# Patient Record
Sex: Male | Born: 1960
Health system: Southern US, Community
[De-identification: ages and names within clinical notes are randomized; demographics above are authoritative.]

## PROBLEM LIST (undated history)

## (undated) DIAGNOSIS — T7840XA Allergy, unspecified, initial encounter: Secondary | ICD-10-CM

## (undated) DIAGNOSIS — R569 Unspecified convulsions: Secondary | ICD-10-CM

## (undated) HISTORY — DX: Allergy, unspecified, initial encounter: T78.40XA

---

## 2005-01-11 ENCOUNTER — Ambulatory Visit (HOSPITAL_BASED_OUTPATIENT_CLINIC_OR_DEPARTMENT_OTHER): Admission: RE | Admit: 2005-01-11 | Discharge: 2005-01-11 | Payer: Self-pay | Admitting: Urology

## 2005-01-11 ENCOUNTER — Ambulatory Visit (HOSPITAL_COMMUNITY): Admission: RE | Admit: 2005-01-11 | Discharge: 2005-01-11 | Payer: Self-pay | Admitting: Urology

## 2010-07-24 ENCOUNTER — Emergency Department (HOSPITAL_COMMUNITY)
Admission: EM | Admit: 2010-07-24 | Discharge: 2010-07-24 | Payer: Self-pay | Source: Home / Self Care | Admitting: Emergency Medicine

## 2010-11-17 NOTE — Op Note (Signed)
[name], Joel Hughes            ACCOUNT NO.:  [id number]   MEDICAL RECORD NO.:  [id number]          PATIENT TYPE:  AMB   LOCATION:  [facility]                         FACILITY:  [facility]   PHYSICIAN:  Bertram Millard. Dahlstedt, M.D.DATE OF BIRTH:  [birth year]   DATE OF PROCEDURE:  01/11/2005  DATE OF DISCHARGE:                                 OPERATIVE REPORT   PREOPERATIVE DIAGNOSIS:  Left varicocele with abnormal semenanalysis.   POSTOPERATIVE DIAGNOSIS:  Left varicocele with abnormal semenanalysis.   PRINCIPAL PROCEDURE:  Laparoscopic left varicocele ligation.   SURGEON:  Bertram Millard. Dahlstedt, M.D.   FIRST ASSISTANT:  Blanch Media, M.D.   COMPLICATIONS:  None.   ANESTHESIA:  General,   BRIEF HISTORY:  This 50 year old male recently presented to me with several  urologic issues. He was evaluated for hematuria and flank pain. This was  negative. The patient, upon original presentation, did have a varicocele. He  is married and would like to have children in the future.  A semenanalysis  was performed due to the patient's left varicocele and his desire for  producing children. He was found to have an adequate count of 300,000,000  with density of 400 sperm per cc. He had 5% motility with 50% normal forms.  Recheck revealed similar findings.. The patient desires to have management  of his varicocele to improve fertility. He was counseled on the procedure,  risks and complications, as well as other alternatives including open versus  laparoscopic varicocele ligation versus embolism. He has chosen to have  varicocele ligation.   DESCRIPTION OF PROCEDURE:  The patient was administered general anesthetic  after IV antibiotics were administered in the holding area. He was placed in  the supine position. Genitalia and perineum as well as the abdomen were  prepped and draped. His bladder was drained with a catheter. Hasson cannula  was placed into the abdomen through a subumbilical incision. The  abdomen was  insufflated, and a pneumoperitoneum was established. A stab incision was  made just off the midline on the right lower abdomen and a 5-mm trocar  placed. Brief survey of the pelvis was normal. The left inguinal ring was  identified. A small vein was seen entering. There was perhaps a small left  spermatic artery just medial to this. The peritoneum over the artery and  vein was then divided in a T-shaped fashion using the scissors. No other  aberrant veins were seen. There were several small little veins which were  carefully coagulated. Four clips were then placed on the left spermatic vein  after it was skeletonized. At this point, the procedure was felt to have  been completed. The pneumoperitoneum was released. The fascial edges of the  subumbilical incision were closed with 0 Vicryl. Skin edges were  reapproximated in that incision with a 4-0 PDS placed in a subcuticular  fashion. Dermabond was placed on both the small 5-mm port incision and the  subumbilical incision.   The patient tolerated the procedure well. He was awakened and taken to the  PACU in stable condition.   The patient will follow up with me on the 28th  at 3:30. He was discharged on  Vicodin. Activity restrictions were discussed with him preoperatively.       SMD/MEDQ  D:  01/11/2005  T:  01/11/2005  Job:  [id number]

## 2011-06-22 ENCOUNTER — Ambulatory Visit (INDEPENDENT_AMBULATORY_CARE_PROVIDER_SITE_OTHER): Payer: BC Managed Care – PPO

## 2011-06-22 DIAGNOSIS — M109 Gout, unspecified: Secondary | ICD-10-CM

## 2011-07-11 ENCOUNTER — Ambulatory Visit (INDEPENDENT_AMBULATORY_CARE_PROVIDER_SITE_OTHER): Payer: BC Managed Care – PPO

## 2011-07-11 DIAGNOSIS — M79609 Pain in unspecified limb: Secondary | ICD-10-CM

## 2011-07-11 DIAGNOSIS — M109 Gout, unspecified: Secondary | ICD-10-CM

## 2011-10-18 ENCOUNTER — Encounter: Payer: Self-pay | Admitting: Internal Medicine

## 2011-10-18 ENCOUNTER — Ambulatory Visit (INDEPENDENT_AMBULATORY_CARE_PROVIDER_SITE_OTHER): Payer: BC Managed Care – PPO | Admitting: Internal Medicine

## 2011-10-18 ENCOUNTER — Ambulatory Visit: Payer: BC Managed Care – PPO

## 2011-10-18 VITALS — BP 132/80 | HR 65 | Temp 98.5°F | Resp 16 | Ht 68.5 in | Wt 148.4 lb

## 2011-10-18 DIAGNOSIS — Z789 Other specified health status: Secondary | ICD-10-CM

## 2011-10-18 DIAGNOSIS — R490 Dysphonia: Secondary | ICD-10-CM

## 2011-10-18 DIAGNOSIS — S61209A Unspecified open wound of unspecified finger without damage to nail, initial encounter: Secondary | ICD-10-CM

## 2011-10-18 DIAGNOSIS — M25549 Pain in joints of unspecified hand: Secondary | ICD-10-CM

## 2011-10-18 MED ORDER — DOXYCYCLINE HYCLATE 100 MG PO TABS: 100.0000 mg | ORAL_TABLET | Freq: Two times a day (BID) | ORAL | Status: AC

## 2011-10-18 MED ORDER — MUPIROCIN 2 % EX OINT: TOPICAL_OINTMENT | Freq: Three times a day (TID) | CUTANEOUS | Status: AC

## 2011-10-18 NOTE — Progress Notes (Signed)
  Subjective:    Patient ID: Joel Hughes, male    DOB: Jan 15, 1961, 51 y.o.   MRN: [id number]  HPI Has a 3d old wound that is smelly and macerated. Car jack fell on finger causing avulsion like wound. Painful to bend Also continues to have chronic hoarseness, and tender throat for years, ENT consult all normal. Does have allergys, never smoked, no systemic sxs. Does snore.   Review of Systems     Objective:   Physical Exam  Constitutional: He appears well-developed and well-nourished.  HENT:  Right Ear: Tympanic membrane normal.  Left Ear: Tympanic membrane normal.  Nose: Nose normal.  Mouth/Throat: Uvula is midline, oropharynx is clear and moist and mucous membranes are normal.  Neck: Normal range of motion. Neck supple. No thyromegaly present.  Cardiovascular: Normal rate and regular rhythm.   Pulmonary/Chest: Breath sounds normal.  Musculoskeletal: He exhibits edema and tenderness.       Right hand: He exhibits tenderness, laceration and swelling.       Hands:      Crush wound with decreased flexion but no pain on resistance  Lymphadenopathy:    He has no cervical adenopathy.  Neurological: He is alert. He has normal strength. No sensory deficit.    [facility] reading (PRIMARY) by  Dr. Perrin Maltese. NAD        Assessment & Plan:  Wound and contusion index right with maceration from chronically wet dressing. Twice daily wound care Doxy 100mg  bid Bactroban oint. Bid F/up 7--10 days

## 2011-10-25 ENCOUNTER — Ambulatory Visit (INDEPENDENT_AMBULATORY_CARE_PROVIDER_SITE_OTHER): Payer: BC Managed Care – PPO | Admitting: Family Medicine

## 2011-10-25 VITALS — BP 123/79 | HR 60 | Temp 98.1°F | Resp 16 | Ht 68.25 in | Wt 148.8 lb

## 2011-10-25 DIAGNOSIS — S61209A Unspecified open wound of unspecified finger without damage to nail, initial encounter: Secondary | ICD-10-CM

## 2011-10-25 NOTE — Progress Notes (Signed)
Subjective: Patient is here for recheck of his finger wound. It is doing much better by the time he cannot work his [employer] job with it. Is supposed to be on duty tomorrow to Sunday and needs an excuse. His tetanus shot her in so much always came back after about 3 days but is doing okay now.  Objective: Wound on the palmar surface of his right index finger, right across the PIP, is clean and beginning to heal but still needs a lot of healing. There is still some swelling and erythema of the fifth finger, though he says is much better than it was before.  Assessment: Finger wound, slowly improving  Plan Stay off work through this weekend I think by the time he is due to return to work in a week it will be okay for advice he keeps it well taped for a long time until it's a good solid scar.Se

## 2011-10-25 NOTE — Patient Instructions (Addendum)
see. work excuse

## 2011-11-01 ENCOUNTER — Ambulatory Visit (INDEPENDENT_AMBULATORY_CARE_PROVIDER_SITE_OTHER): Payer: BC Managed Care – PPO | Admitting: Internal Medicine

## 2011-11-01 VITALS — BP 121/72 | HR 63 | Temp 98.3°F | Resp 16 | Ht 68.25 in | Wt 149.0 lb

## 2011-11-01 DIAGNOSIS — S68019A Complete traumatic metacarpophalangeal amputation of unspecified thumb, initial encounter: Secondary | ICD-10-CM

## 2011-11-01 DIAGNOSIS — M79644 Pain in right finger(s): Secondary | ICD-10-CM

## 2011-11-01 DIAGNOSIS — M79609 Pain in unspecified limb: Secondary | ICD-10-CM

## 2011-11-01 DIAGNOSIS — S61209A Unspecified open wound of unspecified finger without damage to nail, initial encounter: Secondary | ICD-10-CM

## 2011-11-01 NOTE — Progress Notes (Signed)
  Subjective:    Patient ID: Joel Hughes, male    DOB: 10-16-1960, 51 y.o.   MRN: [id number]  HPI Wound finger and contusion finger and sprain finger all healing   Review of Systems     Objective:   Physical Exam  Improving, anatomic but limited ROM NMV intact     Assessment & Plan:  Wound care  Buddy tape Start stretches

## 2013-10-09 ENCOUNTER — Ambulatory Visit (INDEPENDENT_AMBULATORY_CARE_PROVIDER_SITE_OTHER): Payer: BC Managed Care – PPO | Admitting: Physician Assistant

## 2013-10-09 VITALS — BP 120/72 | HR 76 | Temp 98.3°F | Resp 16 | Ht 67.0 in | Wt 144.0 lb

## 2013-10-09 DIAGNOSIS — Z125 Encounter for screening for malignant neoplasm of prostate: Secondary | ICD-10-CM

## 2013-10-09 DIAGNOSIS — Z13220 Encounter for screening for lipoid disorders: Secondary | ICD-10-CM

## 2013-10-09 DIAGNOSIS — J309 Allergic rhinitis, unspecified: Secondary | ICD-10-CM

## 2013-10-09 DIAGNOSIS — J312 Chronic pharyngitis: Secondary | ICD-10-CM

## 2013-10-09 LAB — POCT CBC
GRANULOCYTE PERCENT: 51.7 % (ref 37–80)
HEMATOCRIT: 48.4 % (ref 43.5–53.7)
HEMOGLOBIN: 15.5 g/dL (ref 14.1–18.1)
LYMPH, POC: 2.7 (ref 0.6–3.4)
MCH, POC: 31.6 pg — AB (ref 27–31.2)
MCHC: 32 g/dL (ref 31.8–35.4)
MCV: 98.6 fL — AB (ref 80–97)
MID (cbc): 0.4 (ref 0–0.9)
MPV: 8.8 fL (ref 0–99.8)
POC GRANULOCYTE: 3.3 (ref 2–6.9)
POC LYMPH %: 42.6 % (ref 10–50)
POC MID %: 5.7 %M (ref 0–12)
Platelet Count, POC: 325 10*3/uL (ref 142–424)
RBC: 4.91 M/uL (ref 4.69–6.13)
RDW, POC: 14.3 %
WBC: 6.3 10*3/uL (ref 4.6–10.2)

## 2013-10-09 MED ORDER — PANTOPRAZOLE SODIUM 40 MG PO TBEC: 40.0000 mg | DELAYED_RELEASE_TABLET | Freq: Every day | ORAL | Status: DC

## 2013-10-09 NOTE — Progress Notes (Signed)
Subjective:    Patient ID: Francis Dowse, male    DOB: 06-05-1961, 53 y.o.   MRN: [id number]  Sore Throat  Pertinent negatives include no coughing, ear discharge, ear pain, stridor or trouble swallowing.    53y.o male pt with hx of GERD presents with itchy eyes for past 3 days.  Pt describes being outside 3 days ago when he started to feel pressure and itchiness in his eyes.  Since then, he has been waking up with clear drainage from the eyes bilaterally.  Associated sinus pressure, post nasal drip, congestion.  He has not tried taking anything for it.  He has had these symptoms now for the past 3 years around this time but never this bad.  Denies cough, sneezing, fever, NVD.  Pt has not had a checkup in a long time and is wondering if he may have high cholesterol or diabetes.  Denies increased thirst, increased frequency in urination or any urinary changes, abd pain.    Pt also with sore throat for as long as he can remember.  He has been diagnosed with GERD in the past and has had upper endoscopy with nml results.  He stopped taking his GERD medication when the heartburn stopped months ago.  However he now has a chronic sore throat without previous GERD feeling or symptoms.  Skin tone lesion 2mm in diameter on L top of scalp, distinct borders, hyperkeratotic, been there for 3 months. Would like it to be removed.  Denies pain, or bleeding from lesion  Review of Systems  Constitutional: Negative for fever, chills, diaphoresis, appetite change and unexpected weight change.  HENT: Positive for postnasal drip and sinus pressure. Negative for ear discharge, ear pain, rhinorrhea, sneezing, sore throat and trouble swallowing.   Eyes: Positive for discharge and itching. Negative for photophobia, pain and visual disturbance.  Respiratory: Negative.  Negative for cough, wheezing and stridor.   Cardiovascular: Negative.   Gastrointestinal: Negative.   Endocrine: Negative.   Genitourinary: Negative.    Musculoskeletal: Negative.   Skin: Negative for rash.  Allergic/Immunologic: Positive for environmental allergies.  Neurological: Negative.   Hematological: Negative.        Objective:   Physical Exam  Constitutional: He is oriented to person, place, and time. He appears well-developed and well-nourished. No distress.  HENT:  Head: Normocephalic and atraumatic.    Right Ear: External ear normal.  Left Ear: External ear normal.  Nose: Nose normal.  Mouth/Throat: No oropharyngeal exudate.  Eyes: EOM are normal. Pupils are equal, round, and reactive to light. Right conjunctiva is injected. Left conjunctiva is injected.  Neck: No thyromegaly present.  Cardiovascular: Normal rate, regular rhythm, normal heart sounds and intact distal pulses.  Exam reveals no gallop and no friction rub.   No murmur heard. Pulmonary/Chest: Effort normal and breath sounds normal. No respiratory distress. He has no wheezes. He exhibits no tenderness.  Lymphadenopathy:    He has no cervical adenopathy.  Neurological: He is alert and oriented to person, place, and time.  Skin: Skin is warm and dry. No rash noted.  See above  Psychiatric: He has a normal mood and affect. His behavior is normal.          Assessment & Plan:   1. Allergic rhinitis Advised pt to try taking an oral OTC agent to control seasonal allergies.  2. Screening for prostate cancer 53y.o pt has never been screened.  Results pending - PSA  3. Screening cholesterol level Will be part of  a well check/annual physical that pt will schedule at his convenience and will return to have further evaluation - POCT CBC - Comprehensive metabolic panel - Lipid panel  4. Chronic pharyngitis Pt denies any lower esophageal GERD symptoms.  Probable laryngopharyngeal reflux given his hx.  Will restart PPI long term.   - pantoprazole (PROTONIX) 40 MG tablet; Take 1 tablet (40 mg total) by mouth daily.  Dispense: 90 tablet; Refill: 3

## 2013-10-09 NOTE — Patient Instructions (Signed)
Try taking Claritin, Zyrtec or Allegra one time every day to reduce your allergy symptoms.  If that doesn't adequately control them, please return to discuss additional/alternative treatments that can help. Also, please schedule a complete physical, at your convenience.

## 2013-10-10 LAB — COMPREHENSIVE METABOLIC PANEL
ALBUMIN: 4.4 g/dL (ref 3.5–5.2)
ALT: 28 U/L (ref 0–53)
AST: 24 U/L (ref 0–37)
Alkaline Phosphatase: 47 U/L (ref 39–117)
BUN: 17 mg/dL (ref 6–23)
CALCIUM: 9.6 mg/dL (ref 8.4–10.5)
CO2: 29 meq/L (ref 19–32)
Chloride: 102 mEq/L (ref 96–112)
Creat: 1.1 mg/dL (ref 0.50–1.35)
GLUCOSE: 80 mg/dL (ref 70–99)
POTASSIUM: 4.2 meq/L (ref 3.5–5.3)
SODIUM: 139 meq/L (ref 135–145)
TOTAL PROTEIN: 7.5 g/dL (ref 6.0–8.3)
Total Bilirubin: 0.4 mg/dL (ref 0.2–1.2)

## 2013-10-10 LAB — LIPID PANEL
CHOLESTEROL: 204 mg/dL — AB (ref 0–200)
HDL: 33 mg/dL — AB (ref >39)
LDL CALC: 117 mg/dL — AB (ref 0–99)
TRIGLYCERIDES: 270 mg/dL — AB (ref <150)
Total CHOL/HDL Ratio: 6.2 Ratio
VLDL: 54 mg/dL — ABNORMAL HIGH (ref 0–40)

## 2013-10-10 LAB — PSA: PSA: 0.34 ng/mL (ref <=4.00)

## 2013-10-10 NOTE — Progress Notes (Signed)
I have examined this patient along with the student and agree.  Lesion on the LEFT scalp treated with cryotherapy (freeze-thaw-freeze). Anticipatory guidance.  Local wound care. Repeat cryotherapy if any lesion remains in 2 weeks.

## 2013-10-12 ENCOUNTER — Telehealth: Payer: Self-pay

## 2013-10-12 MED ORDER — FENOFIBRATE 160 MG PO TABS: 160.0000 mg | ORAL_TABLET | Freq: Every day | ORAL | Status: DC

## 2013-10-12 NOTE — Telephone Encounter (Signed)
Patient states that he is at his pharmacy now and they do not have his cholesterol medication. I see that Proton[facility] was [facility]. Is this the only medicine that was to be sent for patient?  [phone]

## 2013-10-12 NOTE — Telephone Encounter (Signed)
Looks like per lab message that we were starting him on Fenofibrate, but it was not sent in. Sent it in for him and let him know

## 2013-11-02 ENCOUNTER — Ambulatory Visit (INDEPENDENT_AMBULATORY_CARE_PROVIDER_SITE_OTHER): Payer: BC Managed Care – PPO | Admitting: Physician Assistant

## 2013-11-02 VITALS — BP 118/68 | HR 71 | Temp 98.6°F | Resp 17 | Ht 68.0 in | Wt 144.0 lb

## 2013-11-02 DIAGNOSIS — L989 Disorder of the skin and subcutaneous tissue, unspecified: Secondary | ICD-10-CM

## 2013-11-02 DIAGNOSIS — E781 Pure hyperglyceridemia: Secondary | ICD-10-CM

## 2013-11-02 DIAGNOSIS — J029 Acute pharyngitis, unspecified: Secondary | ICD-10-CM

## 2013-11-02 MED ORDER — FLUTICASONE PROPIONATE 50 MCG/ACT NA SUSP: 2.0000 | Freq: Every day | NASAL | Status: DC

## 2013-11-02 MED ORDER — IPRATROPIUM BROMIDE 0.03 % NA SOLN: 2.0000 | Freq: Two times a day (BID) | NASAL | Status: DC

## 2013-11-02 NOTE — Patient Instructions (Signed)
Use the Flonase EVERY day. Use the Atrovent only until your symptoms of sore throat and waking up during the night have resolved.

## 2013-11-02 NOTE — Progress Notes (Signed)
Subjective:    Patient ID: Joel Hughes, male    DOB: 12/12/1960, 53 y.o.   MRN: [id number]  Hyperlipidemia Pertinent negatives include no chest pain or shortness of breath.  Sore Throat  Pertinent negatives include no abdominal pain, congestion, diarrhea, headaches, shortness of breath or vomiting.    53y.o male evaluated for sore throat on 4/10 for suspected laryngopharyngeal reflux and was prescribed protonix presents with sore throat and new onset fatigue since starting medication.  Pt returns and states the protonix is making his throat more dry and making him sleepy.  Said he researched the medication and it said that dry throat is an adverse reaction.  He notices sore throat more so in the morning when waking.  He has also been waking up every hour because he feels like he cannot breathe.  Pt also coughs up dry mucus in his throat occasionally when waking up in the night.  He has been  Feeling tired throughout the day.  His wife notes that he snores loudly and also wonders if at times he stops breathing.  Concerned about lipid levels.  Also started on fenofibrate on 4/10 due to elevated triglycerides of 270.    Review of Systems  Constitutional: Positive for fatigue. Negative for fever, chills and appetite change.  HENT: Positive for sore throat. Negative for congestion, postnasal drip, rhinorrhea and sinus pressure.   Eyes: Negative.   Respiratory: Negative for chest tightness, shortness of breath and wheezing.   Cardiovascular: Negative for chest pain and palpitations.  Gastrointestinal: Negative for nausea, vomiting, abdominal pain, diarrhea and abdominal distention.  Endocrine: Negative.   Genitourinary: Negative.   Musculoskeletal: Negative.   Skin: Negative for rash.  Allergic/Immunologic: Negative.   Neurological: Negative for light-headedness, numbness and headaches.       Objective:   Physical Exam  Constitutional: He is oriented to person, place, and time. He  appears well-developed and well-nourished. No distress.  BP 118/68  Pulse 71  Temp(Src) 98.6 F (37 C) (Oral)  Resp 17  Ht 5\' 8"  (1.727 m)  Wt 144 lb (65.318 kg)  BMI 21.90 kg/m2  SpO2 99%   HENT:  Head: Normocephalic.    Nose: Nose normal.  Mouth/Throat: Oropharynx is clear and moist. No oropharyngeal exudate.  Skin toned 2mm raised crusty lesion with clear borders and symmetrical in shape  Eyes: Conjunctivae are normal. Pupils are equal, round, and reactive to light.  Neck: Normal range of motion.  Cardiovascular: Normal rate, regular rhythm, normal heart sounds and intact distal pulses.  Exam reveals no gallop and no friction rub.   No murmur heard. Pulmonary/Chest: Effort normal and breath sounds normal. No respiratory distress. He has no wheezes. He exhibits no tenderness.  Abdominal: Soft. Bowel sounds are normal. He exhibits no distension and no mass. There is no tenderness. There is no rebound.  Lymphadenopathy:    He has no cervical adenopathy.  Neurological: He is alert and oriented to person, place, and time.  Skin: Skin is warm and dry. No rash noted.  Psychiatric: He has a normal mood and affect. His behavior is normal.    Cryotherapy (freeze-thaw-freeze) performed on scalp lesion      Assessment & Plan:   1. Sore throat Etiology unclear at this point.  Will treat for allergies, because coupled with mouth breathing, can cause sore throat.  However if treatment is unsuccessful consider sleep apnea and LPR (although he did have a negative upper endoscopy).   - fluticasone (FLONASE) 50  MCG/ACT nasal spray; Place 2 sprays into both nostrils daily.  Dispense: 16 g; Refill: 12 - ipratropium (ATROVENT) 0.03 % nasal spray; Place 2 sprays into both nostrils 2 (two) times daily.  Dispense: 30 mL; Refill: 2  2. Skin lesion of scalp Cryotherapy repeated.  Anticipatory guidance provided.  3. Hypertriglyceridemia Pt concerned about elevated levels.  Explained it is too  early to recheck, will wait until 3 month mark.

## 2013-11-02 NOTE — Progress Notes (Signed)
I have examined this patient along with the student and agree.  

## 2013-11-04 ENCOUNTER — Telehealth: Payer: Self-pay | Admitting: Family Medicine

## 2013-11-04 NOTE — Telephone Encounter (Signed)
Message copied by Tilman NeatPETTY, [place] Welliver L on Wed Nov 04, 2013  1:46 PM ------      Message from: JEFFERY, CHELLE S      Created: Mon Nov 02, 2013  8:14 PM       15 minutes at 104 ------

## 2013-11-04 NOTE — Telephone Encounter (Signed)
LMOM to CB to make appt. 

## 2013-11-04 NOTE — Progress Notes (Signed)
Left message for patient to call back and schedule appointment.

## 2014-07-09 ENCOUNTER — Encounter: Payer: Self-pay | Admitting: Family Medicine

## 2014-07-09 ENCOUNTER — Ambulatory Visit (INDEPENDENT_AMBULATORY_CARE_PROVIDER_SITE_OTHER): Payer: BLUE CROSS/BLUE SHIELD | Admitting: Family Medicine

## 2014-07-09 VITALS — BP 123/76 | HR 65 | Temp 97.9°F | Resp 16 | Ht 68.5 in | Wt 145.0 lb

## 2014-07-09 DIAGNOSIS — J302 Other seasonal allergic rhinitis: Secondary | ICD-10-CM

## 2014-07-09 DIAGNOSIS — Z1329 Encounter for screening for other suspected endocrine disorder: Secondary | ICD-10-CM

## 2014-07-09 DIAGNOSIS — E785 Hyperlipidemia, unspecified: Secondary | ICD-10-CM

## 2014-07-09 DIAGNOSIS — J029 Acute pharyngitis, unspecified: Secondary | ICD-10-CM

## 2014-07-09 LAB — LIPID PANEL
Cholesterol: 201 mg/dL — ABNORMAL HIGH (ref 0–200)
HDL: 33 mg/dL — ABNORMAL LOW (ref >39)
LDL Cholesterol: 127 mg/dL — ABNORMAL HIGH (ref 0–99)
Total CHOL/HDL Ratio: 6.1 Ratio
Triglycerides: 206 mg/dL — ABNORMAL HIGH (ref <150)
VLDL: 41 mg/dL — ABNORMAL HIGH (ref 0–40)

## 2014-07-09 LAB — COMPLETE METABOLIC PANEL WITHOUT GFR
AST: 19 U/L (ref 0–37)
Albumin: 4 g/dL (ref 3.5–5.2)
BUN: 14 mg/dL (ref 6–23)
CO2: 29 meq/L (ref 19–32)
Glucose, Bld: 84 mg/dL (ref 70–99)
Total Protein: 7.1 g/dL (ref 6.0–8.3)

## 2014-07-09 LAB — POCT RAPID STREP A (OFFICE): Rapid Strep A Screen: NEGATIVE

## 2014-07-09 LAB — COMPLETE METABOLIC PANEL WITH GFR
ALT: 21 U/L (ref 0–53)
Alkaline Phosphatase: 44 U/L (ref 39–117)
Calcium: 9 mg/dL (ref 8.4–10.5)
Chloride: 102 mEq/L (ref 96–112)
Creat: 1.02 mg/dL (ref 0.50–1.35)
GFR, Est African American: >89 mL/min
GFR, Est Non African American: 84 mL/min
Potassium: 4.2 mEq/L (ref 3.5–5.3)
Sodium: 139 mEq/L (ref 135–145)
Total Bilirubin: 0.6 mg/dL (ref 0.2–1.2)

## 2014-07-09 NOTE — Progress Notes (Signed)
Chief Complaint:  Chief Complaint  Patient presents with  . Establish care  . Sore Throat    HPI: Joel Hughes is a 54 y.o. male who is here for hyperlipidemia nd to establish care. He has allergies for the last 5 years. He wasn't to know if he has a bacterial infection, he has had a tickle in the back of his thorat. He ahs taken allergy meds but they did not help and also nasal spray flonase and that did not help so he stopped. He has not complaints he ahs tried to change his diet and exercise. He works with [employer] and is happy with his job, he has been here for over 9 years. He has no children. He does not take meds because he doe snot want to have SEs fromt he fenofibrate.    Past Medical History  Diagnosis Date  . Allergy    No past surgical history on file. History   Social History  . Marital Status: Married    Spouse Name: N/A    Number of Children: N/A  . Years of Education: N/A   Social History Main Topics  . Smoking status: Never Smoker   . Smokeless tobacco: None  . Alcohol Use: None  . Drug Use: None  . Sexual Activity: None   Other Topics Concern  . None   Social History Narrative   No family history on file. No Known Allergies Prior to Admission medications   Medication Sig Start Date End Date Taking? Authorizing Provider  fenofibrate 160 MG tablet Take 1 tablet (160 mg total) by mouth daily. Patient not taking: Reported on 07/09/2014 10/12/13   Chelle S Jeffery, PA-C  fluticasone (FLONASE) 50 MCG/ACT nasal spray Place 2 sprays into both nostrils daily. Patient not taking: Reported on 07/09/2014 11/02/13   Chelle S Jeffery, PA-C  ipratropium (ATROVENT) 0.03 % nasal spray Place 2 sprays into both nostrils 2 (two) times daily. Patient not taking: Reported on 07/09/2014 11/02/13   Chelle S Jeffery, PA-C  pantoprazole (PROTONIX) 40 MG tablet Take 1 tablet (40 mg total) by mouth daily. Patient not taking: Reported on 07/09/2014 10/09/13   Chelle S  Jeffery, PA-C     ROS: The patient denies fevers, chills, night sweats, unintentional weight loss, chest pain, palpitations, wheezing, dyspnea on exertion, nausea, vomiting, abdominal pain, dysuria, hematuria, melena, numbness, weakness, or tingling.   All other systems have been reviewed and were otherwise negative with the exception of those mentioned in the HPI and as above.    PHYSICAL EXAM: Filed Vitals:   07/09/14 1047  BP: 123/76  Pulse: 65  Temp: 97.9 F (36.6 C)  Resp: 16   Filed Vitals:   07/09/14 1047  Height: 5' 8.5" (1.74 m)  Weight: 145 lb (65.772 kg)   Body mass index is 21.72 kg/(m^2).  General: Alert, no acute distress HEENT:  Normocephalic, atraumatic, oropharynx patent. EOMI, PERRLA Erythematous throat, no exudates, TM normal, neg sinus tenderness, + erythematous/boggy nasal mucosa Cardiovascular:  Regular rate and rhythm, no rubs murmurs or gallops.  No Carotid bruits, radial pulse intact. No pedal edema.  Respiratory: Clear to auscultation bilaterally.  No wheezes, rales, or rhonchi.  No cyanosis, no use of accessory musculature GI: No organomegaly, abdomen is soft and non-tender, positive bowel sounds.  No masses. Skin: No rashes. Neurologic: Facial musculature symmetric. Psychiatric: Patient is appropriate throughout our interaction. Lymphatic: No cervical lymphadenopathy Musculoskeletal: Gait intact.   LABS: Results for orders  placed or performed in visit on 07/09/14  Culture, Group A Strep  Result Value Ref Range   Organism ID, Bacteria Normal Upper Respiratory Flora    Organism ID, Bacteria No Beta Hemolytic Streptococci Isolated   COMPLETE METABOLIC PANEL WITH GFR  Result Value Ref Range   Sodium 139 135 - 145 mEq/L   Potassium 4.2 3.5 - 5.3 mEq/L   Chloride 102 96 - 112 mEq/L   CO2 29 19 - 32 mEq/L   Glucose, Bld 84 70 - 99 mg/dL   BUN 14 6 - 23 mg/dL   Creat 1.61 0.96 - 0.45 mg/dL   Total Bilirubin 0.6 0.2 - 1.2 mg/dL   Alkaline  Phosphatase 44 39 - 117 U/L   AST 19 0 - 37 U/L   ALT 21 0 - 53 U/L   Total Protein 7.1 6.0 - 8.3 g/dL   Albumin 4.0 3.5 - 5.2 g/dL   Calcium 9.0 8.4 - 40.9 mg/dL   GFR, Est African American >89 mL/min   GFR, Est Non African American 84 mL/min  Lipid panel  Result Value Ref Range   Cholesterol 201 (H) 0 - 200 mg/dL   Triglycerides 811 (H) <150 mg/dL   HDL 33 (L) >91 mg/dL   Total CHOL/HDL Ratio 6.1 Ratio   VLDL 41 (H) 0 - 40 mg/dL   LDL Cholesterol 478 (H) 0 - 99 mg/dL  TSH  Result Value Ref Range   TSH 0.422 0.350 - 4.500 uIU/mL  POCT rapid strep A  Result Value Ref Range   Rapid Strep A Screen Negative Negative     EKG/XRAY:   Primary read interpreted by Dr. Conley Rolls at [facility].   ASSESSMENT/PLAN: Encounter Diagnoses  Name Primary?  . Hyperlipidemia Yes  . Seasonal allergies   . Screening for thyroid disorder   . Sore throat    Labs pending Advise to take antihistamine and also netty ptt, nasacrot otc but if nothing else he should try the netty pot since it has no medicines in it F/u prn for annual   Gross sideeffects, risk and benefits, and alternatives of medications d/w patient. Patient is aware that all medications have potential sideeffects and we are unable to predict every sideeffect or drug-drug interaction that may occur.  Hamilton Capri PHUONG, DO 07/12/2014 4:43 PM

## 2014-07-09 NOTE — Patient Instructions (Signed)
Allergies  Allergies may happen from anything your body is sensitive to. This may be food, medicines, pollens, chemicals, and many other things. Food allergies can be severe and deadly.  HOME CARE  If you do not know what causes a reaction, keep a diary. Write down the foods you ate and the symptoms that followed. Avoid foods that cause reactions.  If you have red raised spots (hives) or a rash:  Take medicine as told by your doctor.  Use medicines for red raised spots and itching as needed.  Apply cold cloths (compresses) to the skin. Take a cool bath. Avoid hot baths or showers.  If you are severely allergic:  It is often necessary to go to the hospital after you have treated your reaction.  Wear your medical alert jewelry.  You and your family must learn how to give a allergy shot or use an allergy kit (anaphylaxis kit).  Always carry your allergy kit or shot with you. Use this medicine as told by your doctor if a severe reaction is occurring. GET HELP RIGHT AWAY IF:  You have trouble breathing or are making high-pitched whistling sounds (wheezing).  You have a tight feeling in your chest or throat.  You have a puffy (swollen) mouth.  You have red raised spots, puffiness (swelling), or itching all over your body.  You have had a severe reaction that was helped by your allergy kit or shot. The reaction can return once the medicine has worn off.  You think you are having a food allergy. Symptoms most often happen within 30 minutes of eating a food.  Your symptoms have not gone away within 2 days or are getting worse.  You have new symptoms.  You want to retest yourself with a food or drink you think causes an allergic reaction. Only do this under the care of a doctor. MAKE SURE YOU:   Understand these instructions.  Will watch your condition.  Will get help right away if you are not doing well or get worse. Document Released: 10/13/2012 Document Reviewed:  10/13/2012 ExitCare Patient Information 2015 [employer]. This information is not intended to replace advice given to you by your health care provider. Make sure you discuss any questions you have with your health care provider.  

## 2014-07-10 LAB — TSH: TSH: 0.422 u[IU]/mL (ref 0.350–4.500)

## 2014-07-11 LAB — CULTURE, GROUP A STREP: Organism ID, Bacteria: NORMAL

## 2014-07-16 ENCOUNTER — Telehealth: Payer: Self-pay | Admitting: *Deleted

## 2014-07-16 NOTE — Telephone Encounter (Signed)
Pt called to inquire about lab results. Looks like they still need review. Please advise.

## 2014-07-20 ENCOUNTER — Encounter: Payer: Self-pay | Admitting: Family Medicine

## 2014-09-17 ENCOUNTER — Ambulatory Visit (INDEPENDENT_AMBULATORY_CARE_PROVIDER_SITE_OTHER): Payer: BLUE CROSS/BLUE SHIELD | Admitting: Family Medicine

## 2014-09-17 ENCOUNTER — Encounter: Payer: Self-pay | Admitting: Family Medicine

## 2014-09-17 VITALS — BP 122/68 | HR 62 | Temp 97.3°F | Resp 16 | Ht 68.5 in | Wt 145.0 lb

## 2014-09-17 DIAGNOSIS — E785 Hyperlipidemia, unspecified: Secondary | ICD-10-CM | POA: Diagnosis not present

## 2014-09-17 DIAGNOSIS — G4700 Insomnia, unspecified: Secondary | ICD-10-CM | POA: Diagnosis not present

## 2014-09-17 DIAGNOSIS — Z2821 Immunization not carried out because of patient refusal: Secondary | ICD-10-CM

## 2014-09-17 LAB — LIPID PANEL
Cholesterol: 177 mg/dL (ref 0–200)
HDL: 30 mg/dL — ABNORMAL LOW (ref >=40)
LDL Cholesterol: 105 mg/dL — ABNORMAL HIGH (ref 0–99)
Total CHOL/HDL Ratio: 5.9 Ratio
Triglycerides: 211 mg/dL — ABNORMAL HIGH (ref <150)
VLDL: 42 mg/dL — ABNORMAL HIGH (ref 0–40)

## 2014-09-17 LAB — COMPLETE METABOLIC PANEL WITH GFR
ALT: 21 U/L (ref 0–53)
AST: 17 U/L (ref 0–37)
Albumin: 4.1 g/dL (ref 3.5–5.2)
BUN: 16 mg/dL (ref 6–23)
CO2: 28 mEq/L (ref 19–32)
Calcium: 9.2 mg/dL (ref 8.4–10.5)
Chloride: 104 mEq/L (ref 96–112)
Glucose, Bld: 82 mg/dL (ref 70–99)
Potassium: 4.6 mEq/L (ref 3.5–5.3)
Total Protein: 7 g/dL (ref 6.0–8.3)

## 2014-09-17 LAB — COMPLETE METABOLIC PANEL WITHOUT GFR
Alkaline Phosphatase: 40 U/L (ref 39–117)
Creat: 1.18 mg/dL (ref 0.50–1.35)
GFR, Est African American: 81 mL/min
GFR, Est Non African American: 70 mL/min
Sodium: 141 meq/L (ref 135–145)
Total Bilirubin: 0.4 mg/dL (ref 0.2–1.2)

## 2014-09-17 NOTE — Progress Notes (Deleted)
Chief Complaint:  Chief Complaint  Patient presents with  . cholestrol check    HPI: Joel Hughes is a 54 y.o. male who is here for  ***  Past Medical History  Diagnosis Date  . Allergy    No past surgical history on file. History   Social History  . Marital Status: Married    Spouse Name: N/A  . Number of Children: N/A  . Years of Education: N/A   Social History Main Topics  . Smoking status: Never Smoker   . Smokeless tobacco: Not on file  . Alcohol Use: Not on file  . Drug Use: Not on file  . Sexual Activity: Not on file   Other Topics Concern  . None   Social History Narrative   No family history on file. No Known Allergies Prior to Admission medications   Medication Sig Start Date End Date Taking? Authorizing Provider  fenofibrate 160 MG tablet Take 1 tablet (160 mg total) by mouth daily. Patient not taking: Reported on 07/09/2014 10/12/13   Chelle S Jeffery, PA-C  fluticasone (FLONASE) 50 MCG/ACT nasal spray Place 2 sprays into both nostrils daily. Patient not taking: Reported on 07/09/2014 11/02/13   Chelle S Jeffery, PA-C  ipratropium (ATROVENT) 0.03 % nasal spray Place 2 sprays into both nostrils 2 (two) times daily. Patient not taking: Reported on 07/09/2014 11/02/13   Chelle S Jeffery, PA-C  pantoprazole (PROTONIX) 40 MG tablet Take 1 tablet (40 mg total) by mouth daily. Patient not taking: Reported on 07/09/2014 10/09/13   Chelle S Jeffery, PA-C     ROS: The patient denies fevers, chills, night sweats, unintentional weight loss, chest pain, palpitations, wheezing, dyspnea on exertion, nausea, vomiting, abdominal pain, dysuria, hematuria, melena, numbness, weakness, or tingling. ***  All other systems have been reviewed and were otherwise negative with the exception of those mentioned in the HPI and as above.    PHYSICAL EXAM: Filed Vitals:   09/17/14 0816  BP: 122/68  Pulse: 62  Temp: 97.3 F (36.3 C)  Resp: 16   Filed Vitals:   09/17/14 0816   Height: 5' 8.5" (1.74 m)  Weight: 145 lb (65.772 kg)   Body mass index is 21.72 kg/(m^2).  General: Alert, no acute distress HEENT:  Normocephalic, atraumatic, oropharynx patent. EOMI, PERRLA Cardiovascular:  Regular rate and rhythm, no rubs murmurs or gallops.  No Carotid bruits, radial pulse intact. No pedal edema.  Respiratory: Clear to auscultation bilaterally.  No wheezes, rales, or rhonchi.  No cyanosis, no use of accessory musculature GI: No organomegaly, abdomen is soft and non-tender, positive bowel sounds.  No masses. Skin: No rashes. Neurologic: Facial musculature symmetric. Psychiatric: Patient is appropriate throughout our interaction. Lymphatic: No cervical lymphadenopathy Musculoskeletal: Gait intact.   LABS: Results for orders placed or performed in visit on 07/09/14  Culture, Group A Strep  Result Value Ref Range   Organism ID, Bacteria Normal Upper Respiratory Flora    Organism ID, Bacteria No Beta Hemolytic Streptococci Isolated   COMPLETE METABOLIC PANEL WITH GFR  Result Value Ref Range   Sodium 139 135 - 145 mEq/L   Potassium 4.2 3.5 - 5.3 mEq/L   Chloride 102 96 - 112 mEq/L   CO2 29 19 - 32 mEq/L   Glucose, Bld 84 70 - 99 mg/dL   BUN 14 6 - 23 mg/dL   Creat 1.611.02 0.960.50 - 0.451.35 mg/dL   Total Bilirubin 0.6 0.2 - 1.2 mg/dL   Alkaline Phosphatase 44  39 - 117 U/L   AST 19 0 - 37 U/L   ALT 21 0 - 53 U/L   Total Protein 7.1 6.0 - 8.3 g/dL   Albumin 4.0 3.5 - 5.2 g/dL   Calcium 9.0 8.4 - 95.6 mg/dL   GFR, Est African American >89 mL/min   GFR, Est Non African American 84 mL/min  Lipid panel  Result Value Ref Range   Cholesterol 201 (H) 0 - 200 mg/dL   Triglycerides 213 (H) <150 mg/dL   HDL 33 (L) >08 mg/dL   Total CHOL/HDL Ratio 6.1 Ratio   VLDL 41 (H) 0 - 40 mg/dL   LDL Cholesterol 657 (H) 0 - 99 mg/dL  TSH  Result Value Ref Range   TSH 0.422 0.350 - 4.500 uIU/mL  POCT rapid strep A  Result Value Ref Range   Rapid Strep A Screen Negative Negative      EKG/XRAY:   Primary read interpreted by Dr. Conley Rolls at [facility].   ASSESSMENT/PLAN: No diagnosis found.   Gross sideeffects, risk and benefits, and alternatives of medications d/w patient. Patient is aware that all medications have potential sideeffects and we are unable to predict every sideeffect or drug-drug interaction that may occur.  Hamilton Capri PHUONG, DO 09/17/2014 8:38 AM

## 2014-09-17 NOTE — Patient Instructions (Signed)
Melatonin for helping with sleep Insomnia Insomnia is frequent trouble falling and/or staying asleep. Insomnia can be a long term problem or a short term problem. Both are common. Insomnia can be a short term problem when the wakefulness is related to a certain stress or worry. Long term insomnia is often related to ongoing stress during waking hours and/or poor sleeping habits. Overtime, sleep deprivation itself can make the problem worse. Every little thing feels more severe because you are overtired and your ability to cope is decreased. CAUSES   Stress, anxiety, and depression.  Poor sleeping habits.  Distractions such as TV in the bedroom.  Naps close to bedtime.  Engaging in emotionally charged conversations before bed.  Technical reading before sleep.  Alcohol and other sedatives. They may make the problem worse. They can hurt normal sleep patterns and normal dream activity.  Stimulants such as caffeine for several hours prior to bedtime.  Pain syndromes and shortness of breath can cause insomnia.  Exercise late at night.  Changing time zones may cause sleeping problems (jet lag). It is sometimes helpful to have someone observe your sleeping patterns. They should look for periods of not breathing during the night (sleep apnea). They should also look to see how long those periods last. If you live alone or observers are uncertain, you can also be observed at a sleep clinic where your sleep patterns will be professionally monitored. Sleep apnea requires a checkup and treatment. Give your caregivers your medical history. Give your caregivers observations your family has made about your sleep.  SYMPTOMS   Not feeling rested in the morning.  Anxiety and restlessness at bedtime.  Difficulty falling and staying asleep. TREATMENT   Your caregiver may prescribe treatment for an underlying medical disorders. Your caregiver can give advice or help if you are using alcohol or other  drugs for self-medication. Treatment of underlying problems will usually eliminate insomnia problems.  Medications can be prescribed for short time use. They are generally not recommended for lengthy use.  Over-the-counter sleep medicines are not recommended for lengthy use. They can be habit forming.  You can promote easier sleeping by making lifestyle changes such as:  Using relaxation techniques that help with breathing and reduce muscle tension.  Exercising earlier in the day.  Changing your diet and the time of your last meal. No night time snacks.  Establish a regular time to go to bed.  Counseling can help with stressful problems and worry.  Soothing music and white noise may be helpful if there are background noises you cannot remove.  Stop tedious detailed work at least one hour before bedtime. HOME CARE INSTRUCTIONS   Keep a diary. Inform your caregiver about your progress. This includes any medication side effects. See your caregiver regularly. Take note of:  Times when you are asleep.  Times when you are awake during the night.  The quality of your sleep.  How you feel the next day. This information will help your caregiver care for you.  Get out of bed if you are still awake after 15 minutes. Read or do some quiet activity. Keep the lights down. Wait until you feel sleepy and go back to bed.  Keep regular sleeping and waking hours. Avoid naps.  Exercise regularly.  Avoid distractions at bedtime. Distractions include watching television or engaging in any intense or detailed activity like attempting to balance the household checkbook.  Develop a bedtime ritual. Keep a familiar routine of bathing, brushing your teeth, climbing  into bed at the same time each night, listening to soothing music. Routines increase the success of falling to sleep faster.  Use relaxation techniques. This can be using breathing and muscle tension release routines. It can also include  visualizing peaceful scenes. You can also help control troubling or intruding thoughts by keeping your mind occupied with boring or repetitive thoughts like the old concept of counting sheep. You can make it more creative like imagining planting one beautiful flower after another in your backyard garden.  During your day, work to eliminate stress. When this is not possible use some of the previous suggestions to help reduce the anxiety that accompanies stressful situations. MAKE SURE YOU:   Understand these instructions.  Will watch your condition.  Will get help right away if you are not doing well or get worse. Document Released: 06/15/2000 Document Revised: 09/10/2011 Document Reviewed: 07/16/2007 [facility] Patient Information 2015 [place], [place]. This information is not intended to replace advice given to you by your health care provider. Make sure you discuss any questions you have with your health care provider. Food Choices to Lower Your Triglycerides  Triglycerides are a type of fat in your blood. High levels of triglycerides can increase the risk of heart disease and stroke. If your triglyceride levels are high, the foods you eat and your eating habits are very important. Choosing the right foods can help lower your triglycerides.  WHAT GENERAL GUIDELINES DO I NEED TO FOLLOW?  Lose weight if you are overweight.   Limit or avoid alcohol.   Fill one half of your plate with vegetables and green salads.   Limit fruit to two servings a day. Choose fruit instead of juice.   Make one fourth of your plate whole grains. Look for the word "whole" as the first word in the ingredient list.  Fill one fourth of your plate with lean protein foods.  Enjoy fatty fish (such as salmon, mackerel, sardines, and tuna) three times a week.   Choose healthy fats.   Limit foods high in starch and sugar.  Eat more home-cooked food and less restaurant, buffet, and fast food.  Limit fried  foods.  Cook foods using methods other than frying.  Limit saturated fats.  Check ingredient lists to avoid foods with partially hydrogenated oils (trans fats) in them. WHAT FOODS CAN I EAT?  Grains Whole grains, such as whole wheat or whole grain breads, crackers, cereals, and pasta. Unsweetened oatmeal, bulgur, barley, quinoa, or brown rice. Corn or whole wheat flour tortillas.  Vegetables Fresh or frozen vegetables (raw, steamed, roasted, or grilled). Green salads. Fruits All fresh, canned (in natural juice), or frozen fruits. Meat and Other Protein Products Ground beef (85% or leaner), grass-fed beef, or beef trimmed of fat. Skinless chicken or [place]. Ground chicken or [place]. Pork trimmed of fat. All fish and seafood. Eggs. Dried beans, peas, or lentils. Unsalted nuts or seeds. Unsalted canned or dry beans. Dairy Low-fat dairy products, such as skim or 1% milk, 2% or reduced-fat cheeses, low-fat ricotta or cottage cheese, or plain low-fat yogurt. Fats and Oils Tub margarines without trans fats. Light or reduced-fat mayonnaise and salad dressings. Avocado. Safflower, olive, or canola oils. Natural peanut or almond butter. The items listed above may not be a complete list of recommended foods or beverages. Contact your dietitian for more options. WHAT FOODS ARE NOT RECOMMENDED?  Grains White bread. White pasta. White rice. Cornbread. Bagels, pastries, and croissants. Crackers that contain trans fat. Vegetables White potatoes.  Corn. Creamed or fried vegetables. Vegetables in a cheese sauce. Fruits Dried fruits. Canned fruit in light or heavy syrup. Fruit juice. Meat and Other Protein Products Fatty cuts of meat. Ribs, chicken wings, bacon, sausage, bologna, salami, chitterlings, fatback, hot dogs, bratwurst, and packaged luncheon meats. Dairy Whole or 2% milk, cream, half-and-half, and cream cheese. Whole-fat or sweetened yogurt. Full-fat cheeses. Nondairy creamers and whipped  toppings. Processed cheese, cheese spreads, or cheese curds. Sweets and Desserts Corn syrup, sugars, honey, and molasses. Candy. Jam and jelly. Syrup. Sweetened cereals. Cookies, pies, cakes, donuts, muffins, and ice cream. Fats and Oils Butter, stick margarine, lard, shortening, ghee, or bacon fat. Coconut, palm kernel, or palm oils. Beverages Alcohol. Sweetened drinks (such as sodas, lemonade, and fruit drinks or punches). The items listed above may not be a complete list of foods and beverages to avoid. Contact your dietitian for more information. Document Released: 04/05/2004 Document Revised: 06/23/2013 Document Reviewed: 04/22/2013 [facility] Patient Information 2015 [place]. This information is not intended to replace advice given to you by your health care provider. Make sure you discuss any questions you have with your health care provider.

## 2014-09-17 NOTE — Progress Notes (Signed)
Chief Complaint:  Chief Complaint  Patient presents with  . cholestrol check    HPI: Joel Hughes is a 54 y.o. male who is here for cholesterol recheck. His change the way he eats. They are incorporating more olive oil into his diet. He has not changed his exercise as much. He does not want to be on any medications for high cholesterol. He also would like to know if he can have something for insomnia. He usually doesn't sleep more than 6 hours a day regular. However in the past month he's been only getting about 4 hours. He does not have a difficult time falling asleep but he does earlier than normal for him. He has been getting about 4 hours of sleep in the last month. He has tried Tylenol PM on occasion. He states he has good sleep hygiene, he goes to bed early, sleeps in her room without any electronics, noise and it is dark and cold. He does not have any stress. 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 He will graduate in December  Past Medical History  Diagnosis Date  . Allergy    No past surgical history on file. History   Social History  . Marital Status: Married    Spouse Name: N/A  . Number of Children: N/A  . Years of Education: N/A   Social History Main Topics  . Smoking status: Never Smoker   . Smokeless tobacco: Not on file  . Alcohol Use: Not on file  . Drug Use: Not on file  . Sexual Activity: Not on file   Other Topics Concern  . None   Social History Narrative   No family history on file. No Known Allergies Prior to Admission medications   Medication Sig Start Date End Date Taking? Authorizing Provider  fenofibrate 160 MG tablet Take 1 tablet (160 mg total) by mouth daily. Patient not taking: Reported on 07/09/2014 10/12/13   Chelle S Jeffery, PA-C  fluticasone (FLONASE) 50 MCG/ACT nasal spray Place 2 sprays into both nostrils daily. Patient not taking: Reported on 07/09/2014 11/02/13   Chelle S  Jeffery, PA-C  ipratropium (ATROVENT) 0.03 % nasal spray Place 2 sprays into both nostrils 2 (two) times daily. Patient not taking: Reported on 07/09/2014 11/02/13   Chelle S Jeffery, PA-C  pantoprazole (PROTONIX) 40 MG tablet Take 1 tablet (40 mg total) by mouth daily. Patient not taking: Reported on 07/09/2014 10/09/13   Chelle S Jeffery, PA-C     ROS: The patient denies fevers, chills, night sweats, unintentional weight loss, chest pain, palpitations, wheezing, dyspnea on exertion, nausea, vomiting, abdominal pain, dysuria, hematuria, melena, numbness, weakness, or tingling.   All other systems have been reviewed and were otherwise negative with the exception of those mentioned in the HPI and as above.    PHYSICAL EXAM: Filed Vitals:   09/17/14 0816  BP: 122/68  Pulse: 62  Temp: 97.3 F (36.3 C)  Resp: 16   Filed Vitals:   09/17/14 0816  Height: 5' 8.5" (1.74 m)  Weight: 145 lb (65.772 kg)   Body mass index is 21.72 kg/(m^2).  General: Alert, no acute distress HEENT:  Normocephalic, atraumatic, oropharynx patent. EOMI, PERRLA Cardiovascular:  Regular rate and rhythm, no rubs murmurs or gallops.  No Carotid bruits, radial pulse intact. No pedal edema.  Respiratory: Clear to auscultation bilaterally.  No wheezes, rales, or rhonchi.  No cyanosis, no use  of accessory musculature GI: No organomegaly, abdomen is soft and non-tender, positive bowel sounds.  No masses. Skin: No rashes. Neurologic: Facial musculature symmetric. Psychiatric: Patient is appropriate throughout our interaction. Lymphatic: No cervical lymphadenopathy Musculoskeletal: Gait intact.   LABS: Results for orders placed or performed in visit on 07/09/14  Culture, Group A Strep  Result Value Ref Range   Organism ID, Bacteria Normal Upper Respiratory Flora    Organism ID, Bacteria No Beta Hemolytic Streptococci Isolated   COMPLETE METABOLIC PANEL WITH GFR  Result Value Ref Range   Sodium 139 135 - 145 mEq/L    Potassium 4.2 3.5 - 5.3 mEq/L   Chloride 102 96 - 112 mEq/L   CO2 29 19 - 32 mEq/L   Glucose, Bld 84 70 - 99 mg/dL   BUN 14 6 - 23 mg/dL   Creat 4.09 8.11 - 9.14 mg/dL   Total Bilirubin 0.6 0.2 - 1.2 mg/dL   Alkaline Phosphatase 44 39 - 117 U/L   AST 19 0 - 37 U/L   ALT 21 0 - 53 U/L   Total Protein 7.1 6.0 - 8.3 g/dL   Albumin 4.0 3.5 - 5.2 g/dL   Calcium 9.0 8.4 - 78.2 mg/dL   GFR, Est African American >89 mL/min   GFR, Est Non African American 84 mL/min  Lipid panel  Result Value Ref Range   Cholesterol 201 (H) 0 - 200 mg/dL   Triglycerides 956 (H) <150 mg/dL   HDL 33 (L) >21 mg/dL   Total CHOL/HDL Ratio 6.1 Ratio   VLDL 41 (H) 0 - 40 mg/dL   LDL Cholesterol 308 (H) 0 - 99 mg/dL  TSH  Result Value Ref Range   TSH 0.422 0.350 - 4.500 uIU/mL  POCT rapid strep A  Result Value Ref Range   Rapid Strep A Screen Negative Negative     EKG/XRAY:   Primary read interpreted by Dr. Conley Rolls at [facility].   ASSESSMENT/PLAN: Encounter Diagnoses  Name Primary?  . Hyperlipidemia Yes  . Insomnia   . Influenza vaccination declined    This is a pleasant 54 year old gentleman who is here for recheck of his cholesterol. He has a history of hyperlipidemia without being on any medications. At one point he was prescribed medication but he does not want to take it. He also has some insomnia and seasonal allergies. Labs pending Advise to try a low carbohydrate, low-fat diet with increased exercise at least 30 minutes of cardiovascular exercise 3 times a week if possible. He can try melatonin for sleep aid He can try Zyrtec and also Nasacort/Flonase for seasonal allergies Follow up in 6 months  Gross sideeffects, risk and benefits, and alternatives of medications d/w patient. Patient is aware that all medications have potential sideeffects and we are unable to predict every sideeffect or drug-drug interaction that may occur.  LE, THAO PHUONG, DO 09/17/2014 1:41 PM

## 2014-09-30 ENCOUNTER — Telehealth: Payer: Self-pay | Admitting: Radiology

## 2014-09-30 NOTE — Telephone Encounter (Signed)
Pt inquiring about his labs. They have not been reviewed yet.

## 2014-10-02 ENCOUNTER — Encounter: Payer: Self-pay | Admitting: Family Medicine

## 2014-10-02 ENCOUNTER — Encounter: Payer: Self-pay | Admitting: Radiology

## 2015-10-25 ENCOUNTER — Ambulatory Visit (INDEPENDENT_AMBULATORY_CARE_PROVIDER_SITE_OTHER): Payer: BLUE CROSS/BLUE SHIELD | Admitting: Family Medicine

## 2015-10-25 VITALS — BP 120/74 | HR 70 | Temp 97.9°F | Resp 16 | Ht 68.0 in | Wt 147.0 lb

## 2015-10-25 DIAGNOSIS — E785 Hyperlipidemia, unspecified: Secondary | ICD-10-CM | POA: Diagnosis not present

## 2015-10-25 DIAGNOSIS — Z8739 Personal history of other diseases of the musculoskeletal system and connective tissue: Secondary | ICD-10-CM

## 2015-10-25 DIAGNOSIS — R350 Frequency of micturition: Secondary | ICD-10-CM

## 2015-10-25 DIAGNOSIS — R351 Nocturia: Secondary | ICD-10-CM | POA: Diagnosis not present

## 2015-10-25 DIAGNOSIS — Z1211 Encounter for screening for malignant neoplasm of colon: Secondary | ICD-10-CM | POA: Diagnosis not present

## 2015-10-25 DIAGNOSIS — M25511 Pain in right shoulder: Secondary | ICD-10-CM

## 2015-10-25 DIAGNOSIS — J309 Allergic rhinitis, unspecified: Secondary | ICD-10-CM | POA: Diagnosis not present

## 2015-10-25 DIAGNOSIS — Z8639 Personal history of other endocrine, nutritional and metabolic disease: Secondary | ICD-10-CM | POA: Diagnosis not present

## 2015-10-25 DIAGNOSIS — Z0000 Encounter for general adult medical examination without abnormal findings: Secondary | ICD-10-CM

## 2015-10-25 LAB — POCT URINALYSIS DIP (MANUAL ENTRY)
BILIRUBIN UA: NEGATIVE
BILIRUBIN UA: NEGATIVE
Glucose, UA: NEGATIVE
Leukocytes, UA: NEGATIVE
Nitrite, UA: NEGATIVE
PROTEIN UA: NEGATIVE
SPEC GRAV UA: 1.01
Urobilinogen, UA: 0.2
pH, UA: 6.5

## 2015-10-25 LAB — POC MICROSCOPIC URINALYSIS (UMFC)

## 2015-10-25 NOTE — Patient Instructions (Addendum)
IF you received an x-ray today, you will receive an invoice from [facility]. Please contact [facility] at [phone] with questions or concerns regarding your invoice.   IF you received labwork today, you will receive an invoice from [employer]. Please contact [facility] at [phone] with questions or concerns regarding your invoice.   Our billing staff will not be able to assist you with questions regarding bills from these companies.  You will be contacted with the lab results as soon as they are available. The fastest way to get your results is to activate your My Chart account. Instructions are located on the last page of this paperwork. If you have not heard from [place] regarding the results in 2 weeks, please contact this office.    I will check a prostate test through the blood, but if nighttime urination persists - recommend other prostate test (rectal exam) or evaluation with urologist. Return to the clinic or go to the nearest emergency room if any of your symptoms worsen or new symptoms occur.  Your shoulder pain may be due to some inflammation or soreness of the rotator cuff tendons, or bursitis. You can try Tylenol over-the-counter, or Advil over-the-counter if needed for short time. Also see the exercises listed below. If you are not improving in the next 2-4 weeks, return for x-rays and other evaluation.  We'll check a gout test, but as you have not had recent symptoms, would not start a daily medication at this time. If you do have another gout flare, return to discuss medication options.  I will refer you for the colon cancer screening or colonoscopy. The gastroenterologist office will call you.  Continue routine follow-up with your ear nose and throat specialist for the sore throat, voice change, and hearing difficulty.   Keeping you healthy  Get these tests  Blood pressure- Have your blood pressure checked once a year by  your healthcare provider.  Normal blood pressure is 120/80  Weight- Have your body mass index (BMI) calculated to screen for obesity.  BMI is a measure of body fat based on height and weight. You can also calculate your own BMI at [email or web address].  Cholesterol- Have your cholesterol checked every year.  Diabetes- Have your blood sugar checked regularly if you have high blood pressure, high cholesterol, have a family history of diabetes or if you are overweight.  Screening for Colon Cancer- Colonoscopy starting at age 12.  Screening may begin sooner depending on your family history and other health conditions. Follow up colonoscopy as directed by your Gastroenterologist.  Screening for Prostate Cancer- Both blood work (PSA) and a rectal exam help screen for Prostate Cancer.  Screening begins at age 60 with African-American men and at age 20 with Caucasian men.  Screening may begin sooner depending on your family history.  Take these medicines  Aspirin- One aspirin daily can help prevent Heart disease and Stroke.  Flu shot- Every fall.  Tetanus- Every 10 years.  Zostavax- Once after the age of 61 to prevent Shingles.  Pneumonia shot- Once after the age of 10; if you are younger than 42, ask your healthcare provider if you need a Pneumonia shot.  Take these steps  Don't smoke- If you do smoke, talk to your doctor about quitting.  For tips on how to quit, go to www.smokefree.gov or call 1-800-QUIT-NOW.  Be physically active- Exercise 5 days a week for at least 30 minutes.  If you are not already physically  active start slow and gradually work up to 30 minutes of moderate physical activity.  Examples of moderate activity include walking briskly, mowing the yard, dancing, swimming, bicycling, etc.  Eat a healthy diet- Eat a variety of healthy food such as fruits, vegetables, low fat milk, low fat cheese, yogurt, lean meant, poultry, fish, beans, tofu, etc. For more information go to  www.thenutritionsource.org  Drink alcohol in moderation- Limit alcohol intake to less than two drinks a day. Never drink and drive.  Dentist- Brush and floss twice daily; visit your dentist twice a year.  Depression- Your emotional health is as important as your physical health. If you're feeling down, or losing interest in things you would normally enjoy please talk to your healthcare provider.  Eye exam- Visit your eye doctor every year.  Safe sex- If you may be exposed to a sexually transmitted infection, use a condom.  Seat belts- Seat belts can save your life; always wear one.  Smoke/Carbon Monoxide detectors- These detectors need to be installed on the appropriate level of your home.  Replace batteries at least once a year.  Skin cancer- When out in the sun, cover up and use sunscreen 15 SPF or higher.  Violence- If anyone is threatening you, please tell your healthcare provider.  Living Will/ Health care power of attorney- Speak with your healthcare provider and family.  Impingement Syndrome, Rotator Cuff, Bursitis With Rehab Impingement syndrome is a condition that involves inflammation of the tendons of the rotator cuff and the subacromial bursa, that causes pain in the shoulder. The rotator cuff consists of four tendons and muscles that control much of the shoulder and upper arm function. The subacromial bursa is a fluid filled sac that helps reduce friction between the rotator cuff and one of the bones of the shoulder (acromion). Impingement syndrome is usually an overuse injury that causes swelling of the bursa (bursitis), swelling of the tendon (tendonitis), and/or a tear of the tendon (strain). Strains are classified into three categories. Grade 1 strains cause pain, but the tendon is not lengthened. Grade 2 strains include a lengthened ligament, due to the ligament being stretched or partially ruptured. With grade 2 strains there is still function, although the function may be  decreased. Grade 3 strains include a complete tear of the tendon or muscle, and function is usually impaired. SYMPTOMS   Pain around the shoulder, often at the outer portion of the upper arm.  Pain that gets worse with shoulder function, especially when reaching overhead or lifting.  Sometimes, aching when not using the arm.  Pain that wakes you up at night.  Sometimes, tenderness, swelling, warmth, or redness over the affected area.  Loss of strength.  Limited motion of the shoulder, especially reaching behind the back (to the back pocket or to unhook bra) or across your body.  Crackling sound (crepitation) when moving the arm.  Biceps tendon pain and inflammation (in the front of the shoulder). Worse when bending the elbow or lifting. CAUSES  Impingement syndrome is often an overuse injury, in which chronic (repetitive) motions cause the tendons or bursa to become inflamed. A strain occurs when a force is paced on the tendon or muscle that is greater than it can withstand. Common mechanisms of injury include: Stress from sudden increase in duration, frequency, or intensity of training.  Direct hit (trauma) to the shoulder.  Aging, erosion of the tendon with normal use.  Bony bump on shoulder (acromial spur). RISK INCREASES WITH:  Contact sports (  football, wrestling, boxing).  Throwing sports (baseball, tennis, volleyball).  Weightlifting and bodybuilding.  Heavy labor.  Previous injury to the rotator cuff, including impingement.  Poor shoulder strength and flexibility.  Failure to warm up properly before activity.  Inadequate protective equipment.  Old age.  Bony bump on shoulder (acromial spur). PREVENTION   Warm up and stretch properly before activity.  Allow for adequate recovery between workouts.  Maintain physical fitness:  Strength, flexibility, and endurance.  Cardiovascular fitness.  Learn and use proper exercise technique. PROGNOSIS  If  treated properly, impingement syndrome usually goes away within 6 weeks. Sometimes surgery is required.  RELATED COMPLICATIONS   Longer healing time if not properly treated, or if not given enough time to heal.  Recurring symptoms, that result in a chronic condition.  Shoulder stiffness, frozen shoulder, or loss of motion.  Rotator cuff tendon tear.  Recurring symptoms, especially if activity is resumed too soon, with overuse, with a direct blow, or when using poor technique. TREATMENT  Treatment first involves the use of ice and medicine, to reduce pain and inflammation. The use of strengthening and stretching exercises may help reduce pain with activity. These exercises may be performed at home or with a therapist. If non-surgical treatment is unsuccessful after more than 6 months, surgery may be advised. After surgery and rehabilitation, activity is usually possible in 3 months.  MEDICATION  If pain medicine is needed, nonsteroidal anti-inflammatory medicines (aspirin and ibuprofen), or other minor pain relievers (acetaminophen), are often advised.  Do not take pain medicine for 7 days before surgery.  Prescription pain relievers may be given, if your caregiver thinks they are needed. Use only as directed and only as much as you need.  Corticosteroid injections may be given by your caregiver. These injections should be reserved for the most serious cases, because they may only be given a certain number of times. HEAT AND COLD  Cold treatment (icing) should be applied for 10 to 15 minutes every 2 to 3 hours for inflammation and pain, and immediately after activity that aggravates your symptoms. Use ice packs or an ice massage.  Heat treatment may be used before performing stretching and strengthening activities prescribed by your caregiver, physical therapist, or athletic trainer. Use a heat pack or a warm water soak. SEEK MEDICAL CARE IF:   Symptoms get worse or do not improve in 4  to 6 weeks, despite treatment.  New, unexplained symptoms develop. (Drugs used in treatment may produce side effects.) EXERCISES  RANGE OF MOTION (ROM) AND STRETCHING EXERCISES - Impingement Syndrome (Rotator Cuff  Tendinitis, Bursitis) These exercises may help you when beginning to rehabilitate your injury. Your symptoms may go away with or without further involvement from your physician, physical therapist or athletic trainer. While completing these exercises, remember:   Restoring tissue flexibility helps normal motion to return to the joints. This allows healthier, less painful movement and activity.  An effective stretch should be held for at least 30 seconds.  A stretch should never be painful. You should only feel a gentle lengthening or release in the stretched tissue. STRETCH - Flexion, Standing  Stand with good posture. With an underhand grip on your right / left hand, and an overhand grip on the opposite hand, grasp a broomstick or cane so that your hands are a little more than shoulder width apart.  Keeping your right / left elbow straight and shoulder muscles relaxed, push the stick with your opposite hand, to raise your right /  left arm in front of your body and then overhead. Raise your arm until you feel a stretch in your right / left shoulder, but before you have increased shoulder pain.  Try to avoid shrugging your right / left shoulder as your arm rises, by keeping your shoulder blade tucked down and toward your mid-back spine. Hold for __________ seconds.  Slowly return to the starting position. Repeat __________ times. Complete this exercise __________ times per day. STRETCH - Abduction, Supine  Lie on your back. With an underhand grip on your right / left hand and an overhand grip on the opposite hand, grasp a broomstick or cane so that your hands are a little more than shoulder width apart.  Keeping your right / left elbow straight and your shoulder muscles relaxed,  push the stick with your opposite hand, to raise your right / left arm out to the side of your body and then overhead. Raise your arm until you feel a stretch in your right / left shoulder, but before you have increased shoulder pain.  Try to avoid shrugging your right / left shoulder as your arm rises, by keeping your shoulder blade tucked down and toward your mid-back spine. Hold for __________ seconds.  Slowly return to the starting position. Repeat __________ times. Complete this exercise __________ times per day. ROM - Flexion, Active-Assisted  Lie on your back. You may bend your knees for comfort.  Grasp a broomstick or cane so your hands are about shoulder width apart. Your right / left hand should grip the end of the stick, so that your hand is positioned "thumbs-up," as if you were about to shake hands.  Using your healthy arm to lead, raise your right / left arm overhead, until you feel a gentle stretch in your shoulder. Hold for __________ seconds.  Use the stick to assist in returning your right / left arm to its starting position. Repeat __________ times. Complete this exercise __________ times per day.  ROM - Internal Rotation, Supine   Lie on your back on a firm surface. Place your right / left elbow about 60 degrees away from your side. Elevate your elbow with a folded towel, so that the elbow and shoulder are the same height.  Using a broomstick or cane and your strong arm, pull your right / left hand toward your body until you feel a gentle stretch, but no increase in your shoulder pain. Keep your shoulder and elbow in place throughout the exercise.  Hold for __________ seconds. Slowly return to the starting position. Repeat __________ times. Complete this exercise __________ times per day. STRETCH - Internal Rotation  Place your right / left hand behind your back, palm up.  Throw a towel or belt over your opposite shoulder. Grasp the towel with your right / left  hand.  While keeping an upright posture, gently pull up on the towel, until you feel a stretch in the front of your right / left shoulder.  Avoid shrugging your right / left shoulder as your arm rises, by keeping your shoulder blade tucked down and toward your mid-back spine.  Hold for __________ seconds. Release the stretch, by lowering your healthy hand. Repeat __________ times. Complete this exercise __________ times per day. ROM - Internal Rotation   Using an underhand grip, grasp a stick behind your back with both hands.  While standing upright with good posture, slide the stick up your back until you feel a mild stretch in the front of your  shoulder.  Hold for __________ seconds. Slowly return to your starting position. Repeat __________ times. Complete this exercise __________ times per day.  STRETCH - Posterior Shoulder Capsule   Stand or sit with good posture. Grasp your right / left elbow and draw it across your chest, keeping it at the same height as your shoulder.  Pull your elbow, so your upper arm comes in closer to your chest. Pull until you feel a gentle stretch in the back of your shoulder.  Hold for __________ seconds. Repeat __________ times. Complete this exercise __________ times per day. STRENGTHENING EXERCISES - Impingement Syndrome (Rotator Cuff Tendinitis, Bursitis) These exercises may help you when beginning to rehabilitate your injury. They may resolve your symptoms with or without further involvement from your physician, physical therapist or athletic trainer. While completing these exercises, remember:  Muscles can gain both the endurance and the strength needed for everyday activities through controlled exercises.  Complete these exercises as instructed by your physician, physical therapist or athletic trainer. Increase the resistance and repetitions only as guided.  You may experience muscle soreness or fatigue, but the pain or discomfort you are trying  to eliminate should never worsen during these exercises. If this pain does get worse, stop and make sure you are following the directions exactly. If the pain is still present after adjustments, discontinue the exercise until you can discuss the trouble with your clinician.  During your recovery, avoid activity or exercises which involve actions that place your injured hand or elbow above your head or behind your back or head. These positions stress the tissues which you are trying to heal. STRENGTH - Scapular Depression and Adduction   With good posture, sit on a firm chair. Support your arms in front of you, with pillows, arm rests, or on a table top. Have your elbows in line with the sides of your body.  Gently draw your shoulder blades down and toward your mid-back spine. Gradually increase the tension, without tensing the muscles along the top of your shoulders and the back of your neck.  Hold for __________ seconds. Slowly release the tension and relax your muscles completely before starting the next repetition.  After you have practiced this exercise, remove the arm support and complete the exercise in standing as well as sitting position. Repeat __________ times. Complete this exercise __________ times per day.  STRENGTH - Shoulder Abductors, Isometric  With good posture, stand or sit about 4-6 inches from a wall, with your right / left side facing the wall.  Bend your right / left elbow. Gently press your right / left elbow into the wall. Increase the pressure gradually, until you are pressing as hard as you can, without shrugging your shoulder or increasing any shoulder discomfort.  Hold for __________ seconds.  Release the tension slowly. Relax your shoulder muscles completely before you begin the next repetition. Repeat __________ times. Complete this exercise __________ times per day.  STRENGTH - External Rotators, Isometric  Keep your right / left elbow at your side and bend it  90 degrees.  Step into a door frame so that the outside of your right / left wrist can press against the door frame without your upper arm leaving your side.  Gently press your right / left wrist into the door frame, as if you were trying to swing the back of your hand away from your stomach. Gradually increase the tension, until you are pressing as hard as you can, without shrugging your shoulder  or increasing any shoulder discomfort.  Hold for __________ seconds.  Release the tension slowly. Relax your shoulder muscles completely before you begin the next repetition. Repeat __________ times. Complete this exercise __________ times per day.  STRENGTH - Supraspinatus   Stand or sit with good posture. Grasp a __________ weight, or an exercise band or tubing, so that your hand is "thumbs-up," like you are shaking hands.  Slowly lift your right / left arm in a "V" away from your thigh, diagonally into the space between your side and straight ahead. Lift your hand to shoulder height or as far as you can, without increasing any shoulder pain. At first, many people do not lift their hands above shoulder height.  Avoid shrugging your right / left shoulder as your arm rises, by keeping your shoulder blade tucked down and toward your mid-back spine.  Hold for __________ seconds. Control the descent of your hand, as you slowly return to your starting position. Repeat __________ times. Complete this exercise __________ times per day.  STRENGTH - External Rotators  Secure a rubber exercise band or tubing to a fixed object (table, pole) so that it is at the same height as your right / left elbow when you are standing or sitting on a firm surface.  Stand or sit so that the secured exercise band is at your uninjured side.  Bend your right / left elbow 90 degrees. Place a folded towel or small pillow under your right / left arm, so that your elbow is a few inches away from your side.  Keeping the tension  on the exercise band, pull it away from your body, as if pivoting on your elbow. Be sure to keep your body steady, so that the movement is coming only from your rotating shoulder.  Hold for __________ seconds. Release the tension in a controlled manner, as you return to the starting position. Repeat __________ times. Complete this exercise __________ times per day.  STRENGTH - Internal Rotators   Secure a rubber exercise band or tubing to a fixed object (table, pole) so that it is at the same height as your right / left elbow when you are standing or sitting on a firm surface.  Stand or sit so that the secured exercise band is at your right / left side.  Bend your elbow 90 degrees. Place a folded towel or small pillow under your right / left arm so that your elbow is a few inches away from your side.  Keeping the tension on the exercise band, pull it across your body, toward your stomach. Be sure to keep your body steady, so that the movement is coming only from your rotating shoulder.  Hold for __________ seconds. Release the tension in a controlled manner, as you return to the starting position. Repeat __________ times. Complete this exercise __________ times per day.  STRENGTH - Scapular Protractors, Standing   Stand arms length away from a wall. Place your hands on the wall, keeping your elbows straight.  Begin by dropping your shoulder blades down and toward your mid-back spine.  To strengthen your protractors, keep your shoulder blades down, but slide them forward on your rib cage. It will feel as if you are lifting the back of your rib cage away from the wall. This is a subtle motion and can be challenging to complete. Ask your caregiver for further instruction, if you are not sure you are doing the exercise correctly.  Hold for __________ seconds. Slowly return  to the starting position, resting the muscles completely before starting the next repetition. Repeat __________ times.  Complete this exercise __________ times per day. STRENGTH - Scapular Protractors, Supine  Lie on your back on a firm surface. Extend your right / left arm straight into the air while holding a __________ weight in your hand.  Keeping your head and back in place, lift your shoulder off the floor.  Hold for __________ seconds. Slowly return to the starting position, and allow your muscles to relax completely before starting the next repetition. Repeat __________ times. Complete this exercise __________ times per day. STRENGTH - Scapular Protractors, Quadruped  Get onto your hands and knees, with your shoulders directly over your hands (or as close as you can be, comfortably).  Keeping your elbows locked, lift the back of your rib cage up into your shoulder blades, so your mid-back rounds out. Keep your neck muscles relaxed.  Hold this position for __________ seconds. Slowly return to the starting position and allow your muscles to relax completely before starting the next repetition. Repeat __________ times. Complete this exercise __________ times per day.  STRENGTH - Scapular Retractors  Secure a rubber exercise band or tubing to a fixed object (table, pole), so that it is at the height of your shoulders when you are either standing, or sitting on a firm armless chair.  With a palm down grip, grasp an end of the band in each hand. Straighten your elbows and lift your hands straight in front of you, at shoulder height. Step back, away from the secured end of the band, until it becomes tense.  Squeezing your shoulder blades together, draw your elbows back toward your sides, as you bend them. Keep your upper arms lifted away from your body throughout the exercise.  Hold for __________ seconds. Slowly ease the tension on the band, as you reverse the directions and return to the starting position. Repeat __________ times. Complete this exercise __________ times per day. STRENGTH - Shoulder  Extensors   Secure a rubber exercise band or tubing to a fixed object (table, pole) so that it is at the height of your shoulders when you are either standing, or sitting on a firm armless chair.  With a thumbs-up grip, grasp an end of the band in each hand. Straighten your elbows and lift your hands straight in front of you, at shoulder height. Step back, away from the secured end of the band, until it becomes tense.  Squeezing your shoulder blades together, pull your hands down to the sides of your thighs. Do not allow your hands to go behind you.  Hold for __________ seconds. Slowly ease the tension on the band, as you reverse the directions and return to the starting position. Repeat __________ times. Complete this exercise __________ times per day.  STRENGTH - Scapular Retractors and External Rotators   Secure a rubber exercise band or tubing to a fixed object (table, pole) so that it is at the height as your shoulders, when you are either standing, or sitting on a firm armless chair.  With a palm down grip, grasp an end of the band in each hand. Bend your elbows 90 degrees and lift your elbows to shoulder height, at your sides. Step back, away from the secured end of the band, until it becomes tense.  Squeezing your shoulder blades together, rotate your shoulders so that your upper arms and elbows remain stationary, but your fists travel upward to head height.  Hold for  __________ seconds. Slowly ease the tension on the band, as you reverse the directions and return to the starting position. Repeat __________ times. Complete this exercise __________ times per day.  STRENGTH - Scapular Retractors and External Rotators, Rowing   Secure a rubber exercise band or tubing to a fixed object (table, pole) so that it is at the height of your shoulders, when you are either standing, or sitting on a firm armless chair.  With a palm down grip, grasp an end of the band in each hand. Straighten your  elbows and lift your hands straight in front of you, at shoulder height. Step back, away from the secured end of the band, until it becomes tense.  Step 1: Squeeze your shoulder blades together. Bending your elbows, draw your hands to your chest, as if you are rowing a boat. At the end of this motion, your hands and elbow should be at shoulder height and your elbows should be out to your sides.  Step 2: Rotate your shoulders, to raise your hands above your head. Your forearms should be vertical and your upper arms should be horizontal.  Hold for __________ seconds. Slowly ease the tension on the band, as you reverse the directions and return to the starting position. Repeat __________ times. Complete this exercise __________ times per day.  STRENGTH - Scapular Depressors  Find a sturdy chair without wheels, such as a dining room chair.  Keeping your feet on the floor, and your hands on the chair arms, lift your bottom up from the seat, and lock your elbows.  Keeping your elbows straight, allow gravity to pull your body weight down. Your shoulders will rise toward your ears.  Raise your body against gravity by drawing your shoulder blades down your back, shortening the distance between your shoulders and ears. Although your feet should always maintain contact with the floor, your feet should progressively support less body weight, as you get stronger.  Hold for __________ seconds. In a controlled and slow manner, lower your body weight to begin the next repetition. Repeat __________ times. Complete this exercise __________ times per day.   Urinary Frequency The number of times a normal person urinates depends upon how much liquid they take in and how much liquid they are losing. If the temperature is hot and there is high humidity, then the person will sweat more and usually breathe a little more frequently. These factors decrease the amount of frequency of urination that would be considered  normal. The amount you drink is easily determined, but the amount of fluid lost is sometimes more difficult to calculate.  Fluid is lost in two ways:  Sensible fluid loss is usually measured by the amount of urine that you get rid of. Losses of fluid can also occur with diarrhea.  Insensible fluid loss is more difficult to measure. It is caused by evaporation. Insensible loss of fluid occurs through breathing and sweating. It usually ranges from a little less than a quart to a little more than a quart of fluid a day. In normal temperatures and activity levels, the average person may urinate 4 to 7 times in a 24-hour period. Needing to urinate more often than that could indicate a problem. If one urinates 4 to 7 times in 24 hours and has large volumes each time, that could indicate a different problem from one who urinates 4 to 7 times a day and has small volumes. The time of urinating is also important. Most urinating should  be done during the waking hours. Getting up at night to urinate frequently can indicate some problems. CAUSES  The bladder is the organ in your lower abdomen that holds urine. Like a balloon, it swells some as it fills up. Your nerves sense this and tell you it is time to head for the bathroom. There are a number of reasons that you might feel the need to urinate more often than usual. They include:  Urinary tract infection. This is usually associated with other signs such as burning when you urinate.  In men, problems with the prostate (a walnut-size gland that is located near the tube that carries urine out of your body). There are two reasons why the prostate can cause an increased frequency of urination:  An enlarged prostate that does not let the bladder empty well. If the bladder only half empties when you urinate, then it only has half the capacity to fill before you have to urinate again.  The nerves in the bladder become more hypersensitive with an increased size of the  prostate even if the bladder empties completely.  Pregnancy.  Obesity. Excess weight is more likely to cause a problem for women than for men.  Bladder stones or other bladder problems.  Caffeine.  Alcohol.  Medications. For example, drugs that help the body get rid of extra fluid (diuretics) increase urine production. Some other medicines must be taken with lots of fluids.  Muscle or nerve weakness. This might be the result of a spinal cord injury, a stroke, multiple sclerosis, or Parkinson disease.  Long-standing diabetes can decrease the sensation of the bladder. This loss of sensation makes it harder to sense the bladder needs to be emptied. Over a period of years, the bladder is stretched out by constant overfilling. This weakens the bladder muscles so that the bladder does not empty well and has less capacity to fill with new urine.  Interstitial cystitis (also called painful bladder syndrome). This condition develops because the tissues that line the inside of the bladder are inflamed (inflammation is the body's way of reacting to injury or infection). It causes pain and frequent urination. It occurs in women more often than in men. DIAGNOSIS   To decide what might be causing your urinary frequency, your health care provider will probably:  Ask about symptoms you have noticed.  Ask about your overall health. This will include questions about any medications you are taking.  Do a physical examination.  Order some tests. These might include:  A blood test to check for diabetes or other health issues that could be contributing to the problem.  Urine testing. This could measure the flow of urine and the pressure on the bladder.  A test of your neurological system (the brain, spinal cord, and nerves). This is the system that senses the need to urinate.  A bladder test to check whether it is emptying completely when you urinate.  Cystoscopy. This test uses a thin tube with a  tiny camera on it. It offers a look inside your urethra and bladder to see if there are problems.  Imaging tests. You might be given a contrast dye and then asked to urinate. X-rays are taken to see how your bladder is working. TREATMENT  It is important for you to be evaluated to determine if the amount or frequency that you have is unusual or abnormal. If it is found to be abnormal, the cause should be determined and this can usually be found out  easily. Depending upon the cause, treatment could include medication, stimulation of the nerves, or surgery. There are not too many things that you can do as an individual to change your urinary frequency. It is important that you balance the amount of fluid intake needed to compensate for your activity and the temperature. Medical problems will be diagnosed and taken care of by your physician. There is no particular bladder training such as Kegel exercises that you can do to help urinary frequency. This is an exercise that is usually recommended for people who have leaking of urine when they laugh, cough, or sneeze. HOME CARE INSTRUCTIONS   Take any medications your health care provider prescribed or suggested. Follow the directions carefully.  Practice any lifestyle changes that are recommended. These might include:  Drinking less fluid or drinking at different times of the day. If you need to urinate often during the night, for example, you may need to stop drinking fluids early in the evening.  Cutting down on caffeine or alcohol. They both can make you need to urinate more often than normal. Caffeine is found in coffee, tea, and sodas.  Losing weight, if that is recommended.  Keep a journal or a log. You might be asked to record how much you drink and when and where you feel the need to urinate. This will also help evaluate how well the treatment provided by your physician is working. SEEK MEDICAL CARE IF:   Your need to urinate often gets  worse.  You feel increased pain or irritation when you urinate.  You notice blood in your urine.  You have questions about any medications that your health care provider recommended.  You notice blood, pus, or swelling at the site of any test or treatment procedure.  You develop a fever of more than 100.59F (38.1C). SEEK IMMEDIATE MEDICAL CARE IF:  You develop a fever of more than 102.18F (38.9C).   This information is not intended to replace advice given to you by your health care provider. Make sure you discuss any questions you have with your health care provider.   Document Released: 04/14/2009 Document Revised: 07/09/2014 Document Reviewed: 04/14/2009 [employer] Interactive Patient Education [employer]    This information is not intended to replace advice given to you by your health care provider. Make sure you discuss any questions you have with your health care provider.   Document Released: 06/18/2005 Document Revised: 07/09/2014 Document Reviewed: 09/30/2008 [employer] Interactive Patient Education [employer]

## 2015-10-25 NOTE — Progress Notes (Signed)
Subjective:  By signing my name below, I, [name], attest that this documentation has been prepared under the direction and in the presence of Meredith Staggers, MD. Electronically Signed: [name], Scribe. 10/25/2015 , 5:23 PM .  Patient was seen in Room 14 .   Patient ID: Joel Hughes, male    DOB: 1960/09/10, 55 y.o.   MRN: [id number] Chief Complaint  Patient presents with  . Annual Exam   HPI Joel Hughes is a 55 y.o. male Here for annual physical. H/o HLD, seasonal allergies, insomnia. Last seen March 2016. His last meal was last night.   Right shoulder pain Patient also notes having right shoulder pain that started about 2 months ago. He would often sleep on his right shoulder. He denies taking any medication for his right shoulder. He denies any injury to the area. He's right hand dominant.   Gout He has h/o gout but denies any flare up recently. However, with the cold weather, he feels some soreness in his left toe. His last flare up was a year ago.   Hearing loss He has bilateral hearing loss ongoing for 5-6 years. He's followed by ENT.   Seasonal allergies He has seasonal allergies which causes him to have sore throat and voice hoarseness. He's been taking OTC medications to help treat his allergies.   Cancer Screening Colonoscopy: no colonoscopy done yet.  Prostate:  Lab Results  Component Value Date   PSA 0.34 10/09/2013   Urinary frequency He notes having nocturia twice a night for about 2 months now. He also has urinary frequency during the day too. He denies dysuria or hematuria.  He declines digital rectal exam today.   Immunizations Immunization History  Administered Date(s) Administered  . Tdap 10/18/2011    Depression Screening Depression screen [facility] 2/9 10/25/2015 09/17/2014 07/09/2014  Decreased Interest 0 0 0  Down, Depressed, Hopeless 0 0 0  PHQ - 2 Score 0 0 0    Exercise He reports exercising 1 day a week.   Vision   Visual  Acuity Screening   Right eye Left eye Both eyes  Without correction: 20/15 20/20 20/20   With correction:      He reports being seen by eye doctor at [facility] last year.   Dentist He denies seeing a dentist recently. He reports still having all his natural teeth.   HLD He was treated with Fenofibrate in 2015.   Lab Results  Component Value Date   CHOL 177 09/17/2014   HDL 30* 09/17/2014   LDLCALC 105* 09/17/2014   TRIG 211* 09/17/2014   CHOLHDL 5.9 09/17/2014   Lab Results  Component Value Date   ALT 21 09/17/2014   AST 17 09/17/2014   ALKPHOS 40 09/17/2014   BILITOT 0.4 09/17/2014   Recommended improved diet and exercise. Recheck in 6 months. He denies taking any medications at this time.   Wt Readings from Last 3 Encounters:  10/25/15 147 lb (66.679 kg)  09/17/14 145 lb (65.772 kg)  07/09/14 145 lb (65.772 kg)    Patient Active Problem List   Diagnosis Date Noted  . Hypertriglyceridemia 11/02/2013  . Voice hoarseness 10/18/2011   Past Medical History  Diagnosis Date  . Allergy    History reviewed. No pertinent past surgical history. No Known Allergies Prior to Admission medications   Not on File   Social History   Social History  . Marital Status: Married    Spouse Name: N/A  . Number of Children:  N/A  . Years of Education: N/A   Occupational History  . Not on file.   Social History Main Topics  . Smoking status: Never Smoker   . Smokeless tobacco: Not on file  . Alcohol Use: Not on file  . Drug Use: Not on file  . Sexual Activity: Not on file   Other Topics Concern  . Not on file   Social History Narrative   Review of Systems 13 point ROS - positive for right shoulder pain, hearing loss, seasonal allergies, nocturia, urinary frequency    Objective:   Physical Exam  Constitutional: He is oriented to person, place, and time. He appears well-developed and well-nourished.  HENT:  Head: Normocephalic and atraumatic.  Right Ear:  External ear normal.  Left Ear: External ear normal.  Mouth/Throat: Oropharynx is clear and moist.  Eyes: Conjunctivae and EOM are normal. Pupils are equal, round, and reactive to light.  Neck: Normal range of motion. Neck supple. No thyromegaly present.  Cardiovascular: Normal rate, regular rhythm, normal heart sounds and intact distal pulses.   Pulmonary/Chest: Effort normal and breath sounds normal. No respiratory distress. He has no wheezes.  Abdominal: Soft. He exhibits no distension. There is no tenderness.  Genitourinary:  dre declined.   Musculoskeletal: Normal range of motion. He exhibits no edema or tenderness.  Right shoulder: reported area of pain is posterior lateral acromion  C-spine: full rom, no reproduction of shoulder or arm symptoms with that exam  Lymphadenopathy:    He has no cervical adenopathy.  Neurological: He is alert and oriented to person, place, and time. He has normal reflexes.  Skin: Skin is warm and dry.  Psychiatric: He has a normal mood and affect. His behavior is normal.  Vitals reviewed.  Filed Vitals:   10/25/15 1614  BP: 120/74  Pulse: 70  Temp: 97.9 F (36.6 C)  TempSrc: Oral  Resp: 16  Height:  (1.727 m)  Weight: 147 lb (66.679 kg)  SpO2: 99%   Results for orders placed or performed in visit on 10/25/15  POCT urinalysis dipstick  Result Value Ref Range   Color, UA yellow yellow   Clarity, UA clear clear   Glucose, UA negative negative   Bilirubin, UA negative negative   Ketones, POC UA negative negative   Spec Grav, UA 1.010    Blood, UA trace-intact (A) negative   pH, UA 6.5    Protein Ur, POC negative negative   Urobilinogen, UA 0.2    Nitrite, UA Negative Negative   Leukocytes, UA Negative Negative  POCT Microscopic Urinalysis ([facility])  Result Value Ref Range   WBC,UR,HPF,POC None None WBC/hpf   RBC,UR,HPF,POC None None RBC/hpf   Bacteria Few (A) None, Too numerous to count   Mucus Present (A) Absent   Epithelial  Cells, UR Per Microscopy None None, Too numerous to count cells/hpf      Assessment & Plan:  Joel Hughes is a 55 y.o. male Annual physical exam  - -anticipatory guidance as below in AVS, screening labs above. Health maintenance items as above in HPI discussed/recommended as applicable.   Pain in joint of right shoulder  -RTC tendinosis/impingement.  Tylenol or short course of Advil if needed, range of motion and handout given. Follow-up in 2-4 weeks if not improving.  Allergic rhinitis, unspecified allergic rhinitis type  - Symptomatic care with OTC meds.   Hyperlipidemia - Plan: COMPLETE METABOLIC PANEL WITH GFR, Lipid panel  Labs pending. Currently on diet and exercise treatment only.  Urinary frequency,Nocturia - Plan: PSA, POCT urinalysis dipstick, POCT Microscopic Urinalysis ([facility])  -Check PSA, advised to let me know if symptoms persisted as urology evaluation may be needed, and DRE was declined.  History of gout - Plan: Uric Acid  Screen for colon cancer - Plan: Ambulatory referral to Gastroenterology     No orders of the defined types were placed in this encounter.   Patient Instructions       IF you received an x-ray today, you will receive an invoice from [facility]. Please contact [facility] at [phone] with questions or concerns regarding your invoice.   IF you received labwork today, you will receive an invoice from [employer]. Please contact [facility] at [phone] with questions or concerns regarding your invoice.   Our billing staff will not be able to assist you with questions regarding bills from these companies.  You will be contacted with the lab results as soon as they are available. The fastest way to get your results is to activate your My Chart account. Instructions are located on the last page of this paperwork. If you have not heard from [place] regarding the results in 2 weeks, please contact this  office.    I will check a prostate test through the blood, but if nighttime urination persists - recommend other prostate test (rectal exam) or evaluation with urologist. Return to the clinic or go to the nearest emergency room if any of your symptoms worsen or new symptoms occur.  Your shoulder pain may be due to some inflammation or soreness of the rotator cuff tendons, or bursitis. You can try Tylenol over-the-counter, or Advil over-the-counter if needed for short time. Also see the exercises listed below. If you are not improving in the next 2-4 weeks, return for x-rays and other evaluation.  We'll check a gout test, but as you have not had recent symptoms, would not start a daily medication at this time. If you do have another gout flare, return to discuss medication options.  I will refer you for the colon cancer screening or colonoscopy. The gastroenterologist office will call you.  Continue routine follow-up with your ear nose and throat specialist for the sore throat, voice change, and hearing difficulty.   Keeping you healthy  Get these tests  Blood pressure- Have your blood pressure checked once a year by your healthcare provider.  Normal blood pressure is 120/80  Weight- Have your body mass index (BMI) calculated to screen for obesity.  BMI is a measure of body fat based on height and weight. You can also calculate your own BMI at [email or web address].  Cholesterol- Have your cholesterol checked every year.  Diabetes- Have your blood sugar checked regularly if you have high blood pressure, high cholesterol, have a family history of diabetes or if you are overweight.  Screening for Colon Cancer- Colonoscopy starting at age 69.  Screening may begin sooner depending on your family history and other health conditions. Follow up colonoscopy as directed by your Gastroenterologist.  Screening for Prostate Cancer- Both blood work (PSA) and a rectal exam help screen for Prostate  Cancer.  Screening begins at age 59 with African-American men and at age 17 with Caucasian men.  Screening may begin sooner depending on your family history.  Take these medicines  Aspirin- One aspirin daily can help prevent Heart disease and Stroke.  Flu shot- Every fall.  Tetanus- Every 10 years.  Zostavax- Once after the age of 38 to prevent Shingles.  Pneumonia shot- Once after the age of 39; if you are younger than 26, ask your healthcare provider if you need a Pneumonia shot.  Take these steps  Don't smoke- If you do smoke, talk to your doctor about quitting.  For tips on how to quit, go to www.smokefree.gov or call 1-800-QUIT-NOW.  Be physically active- Exercise 5 days a week for at least 30 minutes.  If you are not already physically active start slow and gradually work up to 30 minutes of moderate physical activity.  Examples of moderate activity include walking briskly, mowing the yard, dancing, swimming, bicycling, etc.  Eat a healthy diet- Eat a variety of healthy food such as fruits, vegetables, low fat milk, low fat cheese, yogurt, lean meant, poultry, fish, beans, tofu, etc. For more information go to www.thenutritionsource.org  Drink alcohol in moderation- Limit alcohol intake to less than two drinks a day. Never drink and drive.  Dentist- Brush and floss twice daily; visit your dentist twice a year.  Depression- Your emotional health is as important as your physical health. If you're feeling down, or losing interest in things you would normally enjoy please talk to your healthcare provider.  Eye exam- Visit your eye doctor every year.  Safe sex- If you may be exposed to a sexually transmitted infection, use a condom.  Seat belts- Seat belts can save your life; always wear one.  Smoke/Carbon Monoxide detectors- These detectors need to be installed on the appropriate level of your home.  Replace batteries at least once a year.  Skin cancer- When out in the sun,  cover up and use sunscreen 15 SPF or higher.  Violence- If anyone is threatening you, please tell your healthcare provider.  Living Will/ Health care power of attorney- Speak with your healthcare provider and family.  Impingement Syndrome, Rotator Cuff, Bursitis With Rehab Impingement syndrome is a condition that involves inflammation of the tendons of the rotator cuff and the subacromial bursa, that causes pain in the shoulder. The rotator cuff consists of four tendons and muscles that control much of the shoulder and upper arm function. The subacromial bursa is a fluid filled sac that helps reduce friction between the rotator cuff and one of the bones of the shoulder (acromion). Impingement syndrome is usually an overuse injury that causes swelling of the bursa (bursitis), swelling of the tendon (tendonitis), and/or a tear of the tendon (strain). Strains are classified into three categories. Grade 1 strains cause pain, but the tendon is not lengthened. Grade 2 strains include a lengthened ligament, due to the ligament being stretched or partially ruptured. With grade 2 strains there is still function, although the function may be decreased. Grade 3 strains include a complete tear of the tendon or muscle, and function is usually impaired. SYMPTOMS   Pain around the shoulder, often at the outer portion of the upper arm.  Pain that gets worse with shoulder function, especially when reaching overhead or lifting.  Sometimes, aching when not using the arm.  Pain that wakes you up at night.  Sometimes, tenderness, swelling, warmth, or redness over the affected area.  Loss of strength.  Limited motion of the shoulder, especially reaching behind the back (to the back pocket or to unhook bra) or across your body.  Crackling sound (crepitation) when moving the arm.  Biceps tendon pain and inflammation (in the front of the shoulder). Worse when bending the elbow or lifting. CAUSES  Impingement  syndrome is often an overuse injury, in which chronic (  repetitive) motions cause the tendons or bursa to become inflamed. A strain occurs when a force is paced on the tendon or muscle that is greater than it can withstand. Common mechanisms of injury include: Stress from sudden increase in duration, frequency, or intensity of training.  Direct hit (trauma) to the shoulder.  Aging, erosion of the tendon with normal use.  Bony bump on shoulder (acromial spur). RISK INCREASES WITH:  Contact sports (football, wrestling, boxing).  Throwing sports (baseball, tennis, volleyball).  Weightlifting and bodybuilding.  Heavy labor.  Previous injury to the rotator cuff, including impingement.  Poor shoulder strength and flexibility.  Failure to warm up properly before activity.  Inadequate protective equipment.  Old age.  Bony bump on shoulder (acromial spur). PREVENTION   Warm up and stretch properly before activity.  Allow for adequate recovery between workouts.  Maintain physical fitness:  Strength, flexibility, and endurance.  Cardiovascular fitness.  Learn and use proper exercise technique. PROGNOSIS  If treated properly, impingement syndrome usually goes away within 6 weeks. Sometimes surgery is required.  RELATED COMPLICATIONS   Longer healing time if not properly treated, or if not given enough time to heal.  Recurring symptoms, that result in a chronic condition.  Shoulder stiffness, frozen shoulder, or loss of motion.  Rotator cuff tendon tear.  Recurring symptoms, especially if activity is resumed too soon, with overuse, with a direct blow, or when using poor technique. TREATMENT  Treatment first involves the use of ice and medicine, to reduce pain and inflammation. The use of strengthening and stretching exercises may help reduce pain with activity. These exercises may be performed at home or with a therapist. If non-surgical treatment is unsuccessful after more  than 6 months, surgery may be advised. After surgery and rehabilitation, activity is usually possible in 3 months.  MEDICATION  If pain medicine is needed, nonsteroidal anti-inflammatory medicines (aspirin and ibuprofen), or other minor pain relievers (acetaminophen), are often advised.  Do not take pain medicine for 7 days before surgery.  Prescription pain relievers may be given, if your caregiver thinks they are needed. Use only as directed and only as much as you need.  Corticosteroid injections may be given by your caregiver. These injections should be reserved for the most serious cases, because they may only be given a certain number of times. HEAT AND COLD  Cold treatment (icing) should be applied for 10 to 15 minutes every 2 to 3 hours for inflammation and pain, and immediately after activity that aggravates your symptoms. Use ice packs or an ice massage.  Heat treatment may be used before performing stretching and strengthening activities prescribed by your caregiver, physical therapist, or athletic trainer. Use a heat pack or a warm water soak. SEEK MEDICAL CARE IF:   Symptoms get worse or do not improve in 4 to 6 weeks, despite treatment.  New, unexplained symptoms develop. (Drugs used in treatment may produce side effects.) EXERCISES  RANGE OF MOTION (ROM) AND STRETCHING EXERCISES - Impingement Syndrome (Rotator Cuff  Tendinitis, Bursitis) These exercises may help you when beginning to rehabilitate your injury. Your symptoms may go away with or without further involvement from your physician, physical therapist or athletic trainer. While completing these exercises, remember:   Restoring tissue flexibility helps normal motion to return to the joints. This allows healthier, less painful movement and activity.  An effective stretch should be held for at least 30 seconds.  A stretch should never be painful. You should only feel a gentle lengthening  or release in the stretched  tissue. STRETCH - Flexion, Standing  Stand with good posture. With an underhand grip on your right / left hand, and an overhand grip on the opposite hand, grasp a broomstick or cane so that your hands are a little more than shoulder width apart.  Keeping your right / left elbow straight and shoulder muscles relaxed, push the stick with your opposite hand, to raise your right / left arm in front of your body and then overhead. Raise your arm until you feel a stretch in your right / left shoulder, but before you have increased shoulder pain.  Try to avoid shrugging your right / left shoulder as your arm rises, by keeping your shoulder blade tucked down and toward your mid-back spine. Hold for __________ seconds.  Slowly return to the starting position. Repeat __________ times. Complete this exercise __________ times per day. STRETCH - Abduction, Supine  Lie on your back. With an underhand grip on your right / left hand and an overhand grip on the opposite hand, grasp a broomstick or cane so that your hands are a little more than shoulder width apart.  Keeping your right / left elbow straight and your shoulder muscles relaxed, push the stick with your opposite hand, to raise your right / left arm out to the side of your body and then overhead. Raise your arm until you feel a stretch in your right / left shoulder, but before you have increased shoulder pain.  Try to avoid shrugging your right / left shoulder as your arm rises, by keeping your shoulder blade tucked down and toward your mid-back spine. Hold for __________ seconds.  Slowly return to the starting position. Repeat __________ times. Complete this exercise __________ times per day. ROM - Flexion, Active-Assisted  Lie on your back. You may bend your knees for comfort.  Grasp a broomstick or cane so your hands are about shoulder width apart. Your right / left hand should grip the end of the stick, so that your hand is positioned  "thumbs-up," as if you were about to shake hands.  Using your healthy arm to lead, raise your right / left arm overhead, until you feel a gentle stretch in your shoulder. Hold for __________ seconds.  Use the stick to assist in returning your right / left arm to its starting position. Repeat __________ times. Complete this exercise __________ times per day.  ROM - Internal Rotation, Supine   Lie on your back on a firm surface. Place your right / left elbow about 60 degrees away from your side. Elevate your elbow with a folded towel, so that the elbow and shoulder are the same height.  Using a broomstick or cane and your strong arm, pull your right / left hand toward your body until you feel a gentle stretch, but no increase in your shoulder pain. Keep your shoulder and elbow in place throughout the exercise.  Hold for __________ seconds. Slowly return to the starting position. Repeat __________ times. Complete this exercise __________ times per day. STRETCH - Internal Rotation  Place your right / left hand behind your back, palm up.  Throw a towel or belt over your opposite shoulder. Grasp the towel with your right / left hand.  While keeping an upright posture, gently pull up on the towel, until you feel a stretch in the front of your right / left shoulder.  Avoid shrugging your right / left shoulder as your arm rises, by keeping your shoulder  blade tucked down and toward your mid-back spine.  Hold for __________ seconds. Release the stretch, by lowering your healthy hand. Repeat __________ times. Complete this exercise __________ times per day. ROM - Internal Rotation   Using an underhand grip, grasp a stick behind your back with both hands.  While standing upright with good posture, slide the stick up your back until you feel a mild stretch in the front of your shoulder.  Hold for __________ seconds. Slowly return to your starting position. Repeat __________ times. Complete this  exercise __________ times per day.  STRETCH - Posterior Shoulder Capsule   Stand or sit with good posture. Grasp your right / left elbow and draw it across your chest, keeping it at the same height as your shoulder.  Pull your elbow, so your upper arm comes in closer to your chest. Pull until you feel a gentle stretch in the back of your shoulder.  Hold for __________ seconds. Repeat __________ times. Complete this exercise __________ times per day. STRENGTHENING EXERCISES - Impingement Syndrome (Rotator Cuff Tendinitis, Bursitis) These exercises may help you when beginning to rehabilitate your injury. They may resolve your symptoms with or without further involvement from your physician, physical therapist or athletic trainer. While completing these exercises, remember:  Muscles can gain both the endurance and the strength needed for everyday activities through controlled exercises.  Complete these exercises as instructed by your physician, physical therapist or athletic trainer. Increase the resistance and repetitions only as guided.  You may experience muscle soreness or fatigue, but the pain or discomfort you are trying to eliminate should never worsen during these exercises. If this pain does get worse, stop and make sure you are following the directions exactly. If the pain is still present after adjustments, discontinue the exercise until you can discuss the trouble with your clinician.  During your recovery, avoid activity or exercises which involve actions that place your injured hand or elbow above your head or behind your back or head. These positions stress the tissues which you are trying to heal. STRENGTH - Scapular Depression and Adduction   With good posture, sit on a firm chair. Support your arms in front of you, with pillows, arm rests, or on a table top. Have your elbows in line with the sides of your body.  Gently draw your shoulder blades down and toward your mid-back  spine. Gradually increase the tension, without tensing the muscles along the top of your shoulders and the back of your neck.  Hold for __________ seconds. Slowly release the tension and relax your muscles completely before starting the next repetition.  After you have practiced this exercise, remove the arm support and complete the exercise in standing as well as sitting position. Repeat __________ times. Complete this exercise __________ times per day.  STRENGTH - Shoulder Abductors, Isometric  With good posture, stand or sit about 4-6 inches from a wall, with your right / left side facing the wall.  Bend your right / left elbow. Gently press your right / left elbow into the wall. Increase the pressure gradually, until you are pressing as hard as you can, without shrugging your shoulder or increasing any shoulder discomfort.  Hold for __________ seconds.  Release the tension slowly. Relax your shoulder muscles completely before you begin the next repetition. Repeat __________ times. Complete this exercise __________ times per day.  STRENGTH - External Rotators, Isometric  Keep your right / left elbow at your side and bend it 90 degrees.  Step into a door frame so that the outside of your right / left wrist can press against the door frame without your upper arm leaving your side.  Gently press your right / left wrist into the door frame, as if you were trying to swing the back of your hand away from your stomach. Gradually increase the tension, until you are pressing as hard as you can, without shrugging your shoulder or increasing any shoulder discomfort.  Hold for __________ seconds.  Release the tension slowly. Relax your shoulder muscles completely before you begin the next repetition. Repeat __________ times. Complete this exercise __________ times per day.  STRENGTH - Supraspinatus   Stand or sit with good posture. Grasp a __________ weight, or an exercise band or tubing, so  that your hand is "thumbs-up," like you are shaking hands.  Slowly lift your right / left arm in a "V" away from your thigh, diagonally into the space between your side and straight ahead. Lift your hand to shoulder height or as far as you can, without increasing any shoulder pain. At first, many people do not lift their hands above shoulder height.  Avoid shrugging your right / left shoulder as your arm rises, by keeping your shoulder blade tucked down and toward your mid-back spine.  Hold for __________ seconds. Control the descent of your hand, as you slowly return to your starting position. Repeat __________ times. Complete this exercise __________ times per day.  STRENGTH - External Rotators  Secure a rubber exercise band or tubing to a fixed object (table, pole) so that it is at the same height as your right / left elbow when you are standing or sitting on a firm surface.  Stand or sit so that the secured exercise band is at your uninjured side.  Bend your right / left elbow 90 degrees. Place a folded towel or small pillow under your right / left arm, so that your elbow is a few inches away from your side.  Keeping the tension on the exercise band, pull it away from your body, as if pivoting on your elbow. Be sure to keep your body steady, so that the movement is coming only from your rotating shoulder.  Hold for __________ seconds. Release the tension in a controlled manner, as you return to the starting position. Repeat __________ times. Complete this exercise __________ times per day.  STRENGTH - Internal Rotators   Secure a rubber exercise band or tubing to a fixed object (table, pole) so that it is at the same height as your right / left elbow when you are standing or sitting on a firm surface.  Stand or sit so that the secured exercise band is at your right / left side.  Bend your elbow 90 degrees. Place a folded towel or small pillow under your right / left arm so that your  elbow is a few inches away from your side.  Keeping the tension on the exercise band, pull it across your body, toward your stomach. Be sure to keep your body steady, so that the movement is coming only from your rotating shoulder.  Hold for __________ seconds. Release the tension in a controlled manner, as you return to the starting position. Repeat __________ times. Complete this exercise __________ times per day.  STRENGTH - Scapular Protractors, Standing   Stand arms length away from a wall. Place your hands on the wall, keeping your elbows straight.  Begin by dropping your shoulder blades down and toward  your mid-back spine.  To strengthen your protractors, keep your shoulder blades down, but slide them forward on your rib cage. It will feel as if you are lifting the back of your rib cage away from the wall. This is a subtle motion and can be challenging to complete. Ask your caregiver for further instruction, if you are not sure you are doing the exercise correctly.  Hold for __________ seconds. Slowly return to the starting position, resting the muscles completely before starting the next repetition. Repeat __________ times. Complete this exercise __________ times per day. STRENGTH - Scapular Protractors, Supine  Lie on your back on a firm surface. Extend your right / left arm straight into the air while holding a __________ weight in your hand.  Keeping your head and back in place, lift your shoulder off the floor.  Hold for __________ seconds. Slowly return to the starting position, and allow your muscles to relax completely before starting the next repetition. Repeat __________ times. Complete this exercise __________ times per day. STRENGTH - Scapular Protractors, Quadruped  Get onto your hands and knees, with your shoulders directly over your hands (or as close as you can be, comfortably).  Keeping your elbows locked, lift the back of your rib cage up into your shoulder  blades, so your mid-back rounds out. Keep your neck muscles relaxed.  Hold this position for __________ seconds. Slowly return to the starting position and allow your muscles to relax completely before starting the next repetition. Repeat __________ times. Complete this exercise __________ times per day.  STRENGTH - Scapular Retractors  Secure a rubber exercise band or tubing to a fixed object (table, pole), so that it is at the height of your shoulders when you are either standing, or sitting on a firm armless chair.  With a palm down grip, grasp an end of the band in each hand. Straighten your elbows and lift your hands straight in front of you, at shoulder height. Step back, away from the secured end of the band, until it becomes tense.  Squeezing your shoulder blades together, draw your elbows back toward your sides, as you bend them. Keep your upper arms lifted away from your body throughout the exercise.  Hold for __________ seconds. Slowly ease the tension on the band, as you reverse the directions and return to the starting position. Repeat __________ times. Complete this exercise __________ times per day. STRENGTH - Shoulder Extensors   Secure a rubber exercise band or tubing to a fixed object (table, pole) so that it is at the height of your shoulders when you are either standing, or sitting on a firm armless chair.  With a thumbs-up grip, grasp an end of the band in each hand. Straighten your elbows and lift your hands straight in front of you, at shoulder height. Step back, away from the secured end of the band, until it becomes tense.  Squeezing your shoulder blades together, pull your hands down to the sides of your thighs. Do not allow your hands to go behind you.  Hold for __________ seconds. Slowly ease the tension on the band, as you reverse the directions and return to the starting position. Repeat __________ times. Complete this exercise __________ times per day.  STRENGTH  - Scapular Retractors and External Rotators   Secure a rubber exercise band or tubing to a fixed object (table, pole) so that it is at the height as your shoulders, when you are either standing, or sitting on a firm armless  chair.  With a palm down grip, grasp an end of the band in each hand. Bend your elbows 90 degrees and lift your elbows to shoulder height, at your sides. Step back, away from the secured end of the band, until it becomes tense.  Squeezing your shoulder blades together, rotate your shoulders so that your upper arms and elbows remain stationary, but your fists travel upward to head height.  Hold for __________ seconds. Slowly ease the tension on the band, as you reverse the directions and return to the starting position. Repeat __________ times. Complete this exercise __________ times per day.  STRENGTH - Scapular Retractors and External Rotators, Rowing   Secure a rubber exercise band or tubing to a fixed object (table, pole) so that it is at the height of your shoulders, when you are either standing, or sitting on a firm armless chair.  With a palm down grip, grasp an end of the band in each hand. Straighten your elbows and lift your hands straight in front of you, at shoulder height. Step back, away from the secured end of the band, until it becomes tense.  Step 1: Squeeze your shoulder blades together. Bending your elbows, draw your hands to your chest, as if you are rowing a boat. At the end of this motion, your hands and elbow should be at shoulder height and your elbows should be out to your sides.  Step 2: Rotate your shoulders, to raise your hands above your head. Your forearms should be vertical and your upper arms should be horizontal.  Hold for __________ seconds. Slowly ease the tension on the band, as you reverse the directions and return to the starting position. Repeat __________ times. Complete this exercise __________ times per day.  STRENGTH - Scapular  Depressors  Find a sturdy chair without wheels, such as a dining room chair.  Keeping your feet on the floor, and your hands on the chair arms, lift your bottom up from the seat, and lock your elbows.  Keeping your elbows straight, allow gravity to pull your body weight down. Your shoulders will rise toward your ears.  Raise your body against gravity by drawing your shoulder blades down your back, shortening the distance between your shoulders and ears. Although your feet should always maintain contact with the floor, your feet should progressively support less body weight, as you get stronger.  Hold for __________ seconds. In a controlled and slow manner, lower your body weight to begin the next repetition. Repeat __________ times. Complete this exercise __________ times per day.   Urinary Frequency The number of times a normal person urinates depends upon how much liquid they take in and how much liquid they are losing. If the temperature is hot and there is high humidity, then the person will sweat more and usually breathe a little more frequently. These factors decrease the amount of frequency of urination that would be considered normal. The amount you drink is easily determined, but the amount of fluid lost is sometimes more difficult to calculate.  Fluid is lost in two ways:  Sensible fluid loss is usually measured by the amount of urine that you get rid of. Losses of fluid can also occur with diarrhea.  Insensible fluid loss is more difficult to measure. It is caused by evaporation. Insensible loss of fluid occurs through breathing and sweating. It usually ranges from a little less than a quart to a little more than a quart of fluid a day. In normal temperatures  and activity levels, the average person may urinate 4 to 7 times in a 24-hour period. Needing to urinate more often than that could indicate a problem. If one urinates 4 to 7 times in 24 hours and has large volumes each time,  that could indicate a different problem from one who urinates 4 to 7 times a day and has small volumes. The time of urinating is also important. Most urinating should be done during the waking hours. Getting up at night to urinate frequently can indicate some problems. CAUSES  The bladder is the organ in your lower abdomen that holds urine. Like a balloon, it swells some as it fills up. Your nerves sense this and tell you it is time to head for the bathroom. There are a number of reasons that you might feel the need to urinate more often than usual. They include:  Urinary tract infection. This is usually associated with other signs such as burning when you urinate.  In men, problems with the prostate (a walnut-size gland that is located near the tube that carries urine out of your body). There are two reasons why the prostate can cause an increased frequency of urination:  An enlarged prostate that does not let the bladder empty well. If the bladder only half empties when you urinate, then it only has half the capacity to fill before you have to urinate again.  The nerves in the bladder become more hypersensitive with an increased size of the prostate even if the bladder empties completely.  Pregnancy.  Obesity. Excess weight is more likely to cause a problem for women than for men.  Bladder stones or other bladder problems.  Caffeine.  Alcohol.  Medications. For example, drugs that help the body get rid of extra fluid (diuretics) increase urine production. Some other medicines must be taken with lots of fluids.  Muscle or nerve weakness. This might be the result of a spinal cord injury, a stroke, multiple sclerosis, or Parkinson disease.  Long-standing diabetes can decrease the sensation of the bladder. This loss of sensation makes it harder to sense the bladder needs to be emptied. Over a period of years, the bladder is stretched out by constant overfilling. This weakens the bladder  muscles so that the bladder does not empty well and has less capacity to fill with new urine.  Interstitial cystitis (also called painful bladder syndrome). This condition develops because the tissues that line the inside of the bladder are inflamed (inflammation is the body's way of reacting to injury or infection). It causes pain and frequent urination. It occurs in women more often than in men. DIAGNOSIS   To decide what might be causing your urinary frequency, your health care provider will probably:  Ask about symptoms you have noticed.  Ask about your overall health. This will include questions about any medications you are taking.  Do a physical examination.  Order some tests. These might include:  A blood test to check for diabetes or other health issues that could be contributing to the problem.  Urine testing. This could measure the flow of urine and the pressure on the bladder.  A test of your neurological system (the brain, spinal cord, and nerves). This is the system that senses the need to urinate.  A bladder test to check whether it is emptying completely when you urinate.  Cystoscopy. This test uses a thin tube with a tiny camera on it. It offers a look inside your urethra and bladder to  see if there are problems.  Imaging tests. You might be given a contrast dye and then asked to urinate. X-rays are taken to see how your bladder is working. TREATMENT  It is important for you to be evaluated to determine if the amount or frequency that you have is unusual or abnormal. If it is found to be abnormal, the cause should be determined and this can usually be found out easily. Depending upon the cause, treatment could include medication, stimulation of the nerves, or surgery. There are not too many things that you can do as an individual to change your urinary frequency. It is important that you balance the amount of fluid intake needed to compensate for your activity and the  temperature. Medical problems will be diagnosed and taken care of by your physician. There is no particular bladder training such as Kegel exercises that you can do to help urinary frequency. This is an exercise that is usually recommended for people who have leaking of urine when they laugh, cough, or sneeze. HOME CARE INSTRUCTIONS   Take any medications your health care provider prescribed or suggested. Follow the directions carefully.  Practice any lifestyle changes that are recommended. These might include:  Drinking less fluid or drinking at different times of the day. If you need to urinate often during the night, for example, you may need to stop drinking fluids early in the evening.  Cutting down on caffeine or alcohol. They both can make you need to urinate more often than normal. Caffeine is found in coffee, tea, and sodas.  Losing weight, if that is recommended.  Keep a journal or a log. You might be asked to record how much you drink and when and where you feel the need to urinate. This will also help evaluate how well the treatment provided by your physician is working. SEEK MEDICAL CARE IF:   Your need to urinate often gets worse.  You feel increased pain or irritation when you urinate.  You notice blood in your urine.  You have questions about any medications that your health care provider recommended.  You notice blood, pus, or swelling at the site of any test or treatment procedure.  You develop a fever of more than 100.28F (38.1C). SEEK IMMEDIATE MEDICAL CARE IF:  You develop a fever of more than 102.89F (38.9C).   This information is not intended to replace advice given to you by your health care provider. Make sure you discuss any questions you have with your health care provider.   Document Released: 04/14/2009 Document Revised: 07/09/2014 Document Reviewed: 04/14/2009 Elsevier Interactive Patient Education [employer]    This information is not  intended to replace advice given to you by your health care provider. Make sure you discuss any questions you have with your health care provider.   Document Released: 06/18/2005 Document Revised: 07/09/2014 Document Reviewed: 09/30/2008 Elsevier Interactive Patient Education [employer]     I personally performed the services described in this documentation, which was scribed in my presence. The recorded information has been reviewed and considered, and addended by me as needed.

## 2015-10-26 LAB — COMPLETE METABOLIC PANEL WITH GFR
ALBUMIN: 4.2 g/dL (ref 3.6–5.1)
ALT: 16 U/L (ref 9–46)
AST: 15 U/L (ref 10–35)
Alkaline Phosphatase: 48 U/L (ref 40–115)
BUN: 13 mg/dL (ref 7–25)
CO2: 27 mmol/L (ref 20–31)
Calcium: 9.7 mg/dL (ref 8.6–10.3)
Chloride: 100 mmol/L (ref 98–110)
Creat: 1.22 mg/dL (ref 0.70–1.33)
GFR, EST NON AFRICAN AMERICAN: 67 mL/min (ref >=60)
GFR, Est African American: 77 mL/min (ref >=60)
GLUCOSE: 80 mg/dL (ref 65–99)
POTASSIUM: 4.2 mmol/L (ref 3.5–5.3)
SODIUM: 140 mmol/L (ref 135–146)
Total Bilirubin: 0.6 mg/dL (ref 0.2–1.2)
Total Protein: 7.9 g/dL (ref 6.1–8.1)

## 2015-10-26 LAB — LIPID PANEL
CHOLESTEROL: 219 mg/dL — AB (ref 125–200)
HDL: 42 mg/dL (ref >=40)
LDL Cholesterol: 148 mg/dL — ABNORMAL HIGH (ref <130)
TRIGLYCERIDES: 147 mg/dL (ref <150)
Total CHOL/HDL Ratio: 5.2 Ratio — ABNORMAL HIGH (ref <=5.0)
VLDL: 29 mg/dL (ref <30)

## 2015-10-26 LAB — URIC ACID: URIC ACID, SERUM: 7.6 mg/dL (ref 4.0–7.8)

## 2015-10-26 LAB — PSA: PSA: 0.65 ng/mL (ref <=4.00)

## 2015-12-28 ENCOUNTER — Encounter (HOSPITAL_COMMUNITY): Payer: Self-pay

## 2015-12-28 ENCOUNTER — Emergency Department (HOSPITAL_COMMUNITY)
Admission: EM | Admit: 2015-12-28 | Discharge: 2015-12-28 | Disposition: A | Discharge status: Home / Self Care | Payer: Self-pay | Attending: Emergency Medicine | Admitting: Emergency Medicine

## 2015-12-28 HISTORY — DX: Unspecified convulsions: R56.9

## 2015-12-28 MED ORDER — LISINOPRIL 10 MG PO TABS
10.0000 mg | ORAL_TABLET | Freq: Once | ORAL | Status: DC
  Filled 2015-12-28: qty 1
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
## 2015-12-28 NOTE — ED Notes (Signed)
DISREGARD. WRONG PT ARRIVED. 

## 2015-12-28 NOTE — ED Notes (Signed)
DISRGARD, WRONG PT ARRIVED.

## 2015-12-28 NOTE — ED Notes (Signed)
WRONG PT ARRIVED.
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
## 2015-12-28 NOTE — ED Notes (Signed)
DISREGARD. WRONG PT ARRIVED.

## 2016-04-11 ENCOUNTER — Ambulatory Visit (INDEPENDENT_AMBULATORY_CARE_PROVIDER_SITE_OTHER): Payer: BLUE CROSS/BLUE SHIELD | Admitting: Family Medicine

## 2016-04-11 DIAGNOSIS — M79675 Pain in left toe(s): Secondary | ICD-10-CM

## 2016-04-11 MED ORDER — COLCHICINE 0.6 MG PO TABS: 0.6000 mg | ORAL_TABLET | Freq: Every day | ORAL | 0 refills | Status: DC

## 2016-04-11 NOTE — Assessment & Plan Note (Signed)
Primarily in first metatarsophalangeal joint. Most consistent with gout given severity of tenderness, personal history of gout, and location of pain.  - Given onset less than 72 hours ago, NSAID tx first line. Colchicine 0.6mg  x7-10d - If no improvement, consider steroid injection or prednisone

## 2016-04-11 NOTE — Progress Notes (Signed)
   Subjective:    Patient ID: Joel Hughes, male    DOB: 09/05/1960, 55 y.o.   MRN: [id number]  HPI   Patient presents for L great toe pain.   L great toe pain Began last night. Patient believes he has gout, as this pain is similar to that he experienced when he was diagnosed with gout five years ago. He has not taken anything for pain yet. Denies any recent trauma. Played soccer last week, but this is not unusual for him. Unable to wear shoes today due to the pain. Endorses some warmth at the affected joint, but denies swelling or erythema.   Review of Systems Denies fevers, chills, pain in other joints.     Objective:   Physical Exam  Constitutional: He is oriented to person, place, and time. He appears well-developed and well-nourished. No distress.  HENT:  Head: Normocephalic and atraumatic.  Pulmonary/Chest: Effort normal. No respiratory distress.  Musculoskeletal:  TTP of L first metatarsophalangeal joint without erythema, warmth, or swelling. No bony abnormalities noted. No TTP or other abnormalities of L foot otherwise. Walking on heel reportedly due to pain.   Neurological: He is alert and oriented to person, place, and time.  Psychiatric: He has a normal mood and affect. His behavior is normal.      Assessment & Plan:  Great toe pain, left Primarily in first metatarsophalangeal joint. Most consistent with gout given severity of tenderness, personal history of gout, and location of pain.  - Given onset less than 72 hours ago, NSAID tx first line. Colchicine 0.6mg  x7-10d - If no improvement, consider steroid injection or prednisone  Tarri AbernethyAbigail J Lancaster, MD, MPH

## 2016-04-11 NOTE — Patient Instructions (Addendum)
  IF you received an x-ray today, you will receive an invoice from [facility] Please contact [facility] at [phone] with questions or concerns regarding your invoice.   IF you received labwork today, you will receive an invoice from [facility]. Please contact [facility] at [phone] with questions or concerns regarding your invoice.   Our billing staff will not be able to assist you with questions regarding bills from these companies.  You will be contacted with the lab results as soon as they are available. The fastest way to get your results is to activate your My Chart account. Instructions are located on the last page of this paperwork. If you have not heard from us regarding the results in 2 weeks, please contact this office.    It was nice meeting you today Mr. Craton!  For your toe pain (gout), begin taking colchicine today.  Take two tablets (1.2 mg) as soon as you pick up the medication, then another tablet (0.6 mg) one hour later.  After that, you can take 1-2 tablets per day until your pain improves.   If you have any questions or concerns, please feel free to call the clinic.   Be well,  Dr. Natale MilchLancaster

## 2016-04-25 ENCOUNTER — Ambulatory Visit (INDEPENDENT_AMBULATORY_CARE_PROVIDER_SITE_OTHER): Payer: BLUE CROSS/BLUE SHIELD | Admitting: Family Medicine

## 2016-04-25 VITALS — BP 130/80 | HR 90 | Temp 98.5°F | Resp 17 | Ht 68.0 in | Wt 143.0 lb

## 2016-04-25 DIAGNOSIS — M1A9XX0 Chronic gout, unspecified, without tophus (tophi): Secondary | ICD-10-CM | POA: Diagnosis not present

## 2016-04-25 MED ORDER — COLCHICINE 0.6 MG PO CAPS: 0.6000 mg | ORAL_CAPSULE | Freq: Every day | ORAL | 2 refills | Status: DC

## 2016-04-25 MED ORDER — ALLOPURINOL 100 MG PO TABS: 100.0000 mg | ORAL_TABLET | Freq: Every day | ORAL | 2 refills | Status: DC
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
## 2016-04-25 NOTE — Patient Instructions (Addendum)
  Great to meet you!   For gout when it hurts:   Colchicine: 1.2 mg at the first sign of flare, followed in 1 hour with a single dose of 0.6 mg (maximum: 1.8 mg within 1 hour). Then take 1 colchicine daily for 1 week  Or take 3 advil 3-4 times daily   To keep gout away:  Take allopurinol 1 pill once a day Take 1 pill of colchicine with it for 1 month, then stop after 1 month  See the handout for information about gout.        IF you received an x-ray today, you will receive an invoice from [facility]. Please contact [facility] at [phone] with questions or concerns regarding your invoice.   IF you received labwork today, you will receive an invoice from [facility]. Please contact [facility] at [phone] with questions or concerns regarding your invoice.   Our billing staff will not be able to assist you with questions regarding bills from these companies.  You will be contacted with the lab results as soon as they are available. The fastest way to get your results is to activate your My Chart account. Instructions are located on the last page of this paperwork. If you have not heard from us regarding the results in 2 weeks, please contact this office.

## 2016-05-02 ENCOUNTER — Telehealth: Payer: Self-pay

## 2016-05-02 NOTE — Telephone Encounter (Signed)
Pt's insurance doesn't cover Colchicine (MITIGARE) 0.6 MG CAPS [[id number]][[id number]]. He would like something similar this script. [id number] - , [place] - [address]. He has been out of his med since 10/29. CB [phone]

## 2016-05-02 NOTE — Telephone Encounter (Signed)
I checked w/pharm bc it is usually just a matter of getting them to try run Rx under generic, name brand Colcrys and name brand Mitigare and one is usually covered w/reasonable co-pay. Pharmacist tried all. Generic is not covered at all, both name brands are covered by Colcyrs is cheapest and still costs $133. Casimiro NeedleMichael, I'll send this to you since you cover for Dr Ermalinda MemosBradshaw.  Checked cost for generic on GoodRx and with free coupon the cheapest is $[facility] at [facility].

## 2016-05-04 NOTE — Telephone Encounter (Signed)
The best I have to offer is prednisone 40 mg for 5 days.  If he would like this please call in.  Deliah BostonMichael Kassadee Carawan, MS, PA-C 2:39 PM, 05/04/2016

## 2016-05-04 NOTE — Telephone Encounter (Signed)
Pt CB and left another VM asking for a cheaper medication he could take for his gout. Joel Hughes, see my notes below.

## 2016-05-07 MED ORDER — PREDNISONE 20 MG PO TABS: 40.0000 mg | ORAL_TABLET | Freq: Every day | ORAL | 0 refills | Status: DC

## 2016-05-07 NOTE — Telephone Encounter (Signed)
[facility] Sent in Rx and explained best way to take Prednisone, advising it will be at pharm if he'd like to pick up to try.

## 2016-05-11 ENCOUNTER — Ambulatory Visit (INDEPENDENT_AMBULATORY_CARE_PROVIDER_SITE_OTHER): Payer: BLUE CROSS/BLUE SHIELD | Admitting: Physician Assistant

## 2016-05-11 VITALS — BP 126/84 | HR 65 | Temp 98.4°F | Resp 16 | Ht 68.0 in | Wt 148.2 lb

## 2016-05-11 DIAGNOSIS — M109 Gout, unspecified: Secondary | ICD-10-CM | POA: Diagnosis not present

## 2016-05-11 DIAGNOSIS — M79675 Pain in left toe(s): Secondary | ICD-10-CM

## 2016-05-11 MED ORDER — INDOMETHACIN 50 MG PO CAPS: 50.0000 mg | ORAL_CAPSULE | Freq: Two times a day (BID) | ORAL | 1 refills | Status: DC

## 2016-05-11 NOTE — Patient Instructions (Addendum)
Take one pill of Indomethacin three times a day for three days, two times a day for three days and one time a day for three days. You will have to refill this prescription.    The general principles of a gout diet are essentially the same as recommendations for a balanced, healthy diet: Weight loss. Being overweight increases the risk of developing gout, and losing weight lowers the risk of gout. Research suggests that reducing the number of calories and losing weight - even without a purine-restricted diet - lowers uric acid levels and reduces the number of gout attacks. Losing weight also lessens the overall stress on joints.  Complex carbs. Eat more fruits, vegetables and whole grains, which provide complex carbohydrates. Avoid foods such as white bread, cakes, candy, sugar-sweetened beverages and products with high-fructose corn syrup.  Water. Keep yourself hydrated by drinking water. An increase in water consumption has been linked to fewer gout attacks. Aim for eight to 16 glasses of fluids a day with at least half of that as water. A glass is 8 ounces (237 milliliters). Talk to your doctor about appropriate fluid intake goals for you.  Fats. Cut back on saturated fats from red meats, fatty poultry and high-fat dairy products.  Proteins. Limit daily proteins from lean meat, fish and poultry to 4 to 6 ounces (113 to 170 grams). Add protein to your diet with low-fat or fat-free dairy products, such as low-fat yogurt or skim milk, which are associated with reduced uric acid levels. Recommendations for specific foods or supplements include the following: High-purine vegetables. Studies have shown that vegetables high in purines do not increase the risk of gout or recurring gout attacks. A healthy diet based on lots of fruits and vegetables can include high-purine vegetables, such as asparagus, spinach, peas, cauliflower or mushrooms. You can also eat beans or lentils, which are moderately high in purines  but are also a good source of protein.  Organ and glandular meats. Avoid meats such as liver, kidney and sweetbreads, which have high purine levels and contribute to high blood levels of uric acid.  Selected seafood. Avoid the following types of seafood, which are higher in purines than others: anchovies, herring, sardines, mussels, scallops, trout, haddock, mackerel and tuna.  Alcohol. The metabolism of alcohol in your body is thought to increase uric acid production, and alcohol contributes to dehydration. Beer is associated with an increased risk of gout and recurring attacks, as are distilled liquors to some extent. The effect of wine is not as well-understood. If you drink alcohol, talk to your doctor about what is appropriate for you.  Vitamin C. Vitamin C may help lower uric acid levels. Talk to your doctor about whether a 500-milligram vitamin C supplement fits into your diet and medication plan.  Coffee. Some research suggests that moderate coffee consumption may be associated with a reduced risk of gout, particularly with regular caffeinated coffee. Drinking coffee may not be appropriate for other medical conditions. Talk to your doctor about how much coffee is right for you.  Cherries. There is some evidence that eating cherries is associated with a reduced risk of gout attacks. A sample menu Here's a look at what you might eat during a typical day on a gout diet: Breakfast Whole-grain, unsweetened cereal with skim or low-fat milk  1 cup fresh strawberries  Coffee  Water Lunch Roasted chicken breast slices (2 ounces) on a whole-grain roll with mustard  Mixed green salad with balsamic vinegar and olive oil  dressing  Skim or low-fat milk  Water Afternoon snack 1 cup fresh cherries  Water Dinner Roasted salmon (3-4 ounces)  Roasted or steamed green beans  1/2 cup whole-grain pasta with olive oil and lemon pepper  Water  Low-fat yogurt  1 cup fresh melon  Caffeine-free beverage,  such as herbal tea   Gout Gout is an inflammatory arthritis caused by a buildup of uric acid crystals in the joints. Uric acid is a chemical that is normally present in the blood. When the level of uric acid in the blood is too high it can form crystals that deposit in your joints and tissues. This causes joint redness, soreness, and swelling (inflammation). Repeat attacks are common. Over time, uric acid crystals can form into masses (tophi) near a joint, destroying bone and causing disfigurement. Gout is treatable and often preventable. CAUSES  The disease begins with elevated levels of uric acid in the blood. Uric acid is produced by your body when it breaks down a naturally found substance called purines. Certain foods you eat, such as meats and fish, contain high amounts of purines. Causes of an elevated uric acid level include:  Being passed down from parent to child (heredity).  Diseases that cause increased uric acid production (such as obesity, psoriasis, and certain cancers).  Excessive alcohol use.  Diet, especially diets rich in meat and seafood.  Medicines, including certain cancer-fighting medicines (chemotherapy), water pills (diuretics), and aspirin.  Chronic kidney disease. The kidneys are no longer able to remove uric acid well.  Problems with metabolism. Conditions strongly associated with gout include:  Obesity.  High blood pressure.  High cholesterol.  Diabetes. Not everyone with elevated uric acid levels gets gout. It is not understood why some people get gout and others do not. Surgery, joint injury, and eating too much of certain foods are some of the factors that can lead to gout attacks. Without treatment, an attack usually goes away in a few days to weeks. Between attacks, you usually will not have symptoms, which is different from many other forms of arthritis. DIAGNOSIS  Your caregiver will suspect gout based on your symptoms and exam. In some cases,  tests may be recommended. The tests may include:  Blood tests.  Urine tests.  X-rays.  Joint fluid exam. This exam requires a needle to remove fluid from the joint (arthrocentesis). Using a microscope, gout is confirmed when uric acid crystals are seen in the joint fluid. TREATMENT  There are two phases to gout treatment: treating the sudden onset (acute) attack and preventing attacks (prophylaxis).  Treatment of an Acute Attack.  Medicines are used. These include anti-inflammatory medicines or steroid medicines.  An injection of steroid medicine into the affected joint is sometimes necessary.  The painful joint is rested. Movement can worsen the arthritis.  You may use warm or cold treatments on painful joints, depending which works best for you.  Treatment to Prevent Attacks.  If you suffer from frequent gout attacks, your caregiver may advise preventive medicine. These medicines are started after the acute attack subsides. These medicines either help your kidneys eliminate uric acid from your body or decrease your uric acid production. You may need to stay on these medicines for a very long time.  The early phase of treatment with preventive medicine can be associated with an increase in acute gout attacks. For this reason, during the first few months of treatment, your caregiver may also advise you to take medicines usually used for acute  gout treatment. Be sure you understand your caregiver's directions. Your caregiver may make several adjustments to your medicine dose before these medicines are effective.  Discuss dietary treatment with your caregiver or dietitian. Alcohol and drinks high in sugar and fructose and foods such as meat, poultry, and seafood can increase uric acid levels. Your caregiver or dietitian can advise you on drinks and foods that should be limited. HOME CARE INSTRUCTIONS   Do not take aspirin to relieve pain. This raises uric acid levels.  Only take  over-the-counter or prescription medicines for pain, discomfort, or fever as directed by your caregiver.  Rest the joint as much as possible. When in bed, keep sheets and blankets off painful areas.  Keep the affected joint raised (elevated).  Apply warm or cold treatments to painful joints. Use of warm or cold treatments depends on which works best for you.  Use crutches if the painful joint is in your leg.  Drink enough fluids to keep your urine clear or pale yellow. This helps your body get rid of uric acid. Limit alcohol, sugary drinks, and fructose drinks.  Follow your dietary instructions. Pay careful attention to the amount of protein you eat. Your daily diet should emphasize fruits, vegetables, whole grains, and fat-free or low-fat milk products. Discuss the use of coffee, vitamin C, and cherries with your caregiver or dietitian. These may be helpful in lowering uric acid levels.  Maintain a healthy body weight.

## 2016-05-11 NOTE — Progress Notes (Signed)
   Joel Hughes  MRN: [id number] DOB: 08/01/1960  PCP: No PCP Per Patient  Subjective:  Pt is a 55 year old male who presents to clinic for gout flare. He was here on 10/11 and 10/25 for the same.   10/11 visit he was prescribed Colchicine, however it is not covered by his insurance so he never filled it.  10/25 visit he stated he was completely better until one day prior. Was prescribed Mitigare and Prednisone, however did not start them until 11/01.   Today he states he is still taking prednisone, is out of Mitigare. Has not tried Indomethacin.  Takes Allopurinol every day for gout flare prevention.  Dietary changes: Eats chicken, talapia, vegetables. No grapefruit, red meat, alcohol.   Review of Systems  Constitutional: Negative for chills, diaphoresis and fever.  Respiratory: Negative for cough, chest tightness, shortness of breath and wheezing.   Cardiovascular: Negative for chest pain, palpitations and leg swelling.  Gastrointestinal: Negative for abdominal pain, diarrhea, nausea and vomiting.  Genitourinary: Negative for difficulty urinating, dysuria, enuresis and frequency.  Musculoskeletal: Positive for arthralgias (left great toe), gait problem and joint swelling. Negative for neck pain.  Skin: Negative.   Neurological: Negative for dizziness, light-headedness and headaches.    Patient Active Problem List   Diagnosis Date Noted  . Great toe pain, left 04/11/2016  . Hypertriglyceridemia 11/02/2013  . Voice hoarseness 10/18/2011    Current Outpatient Prescriptions on File Prior to Visit  Medication Sig Dispense Refill  . allopurinol (ZYLOPRIM) 100 MG tablet Take 1 tablet (100 mg total) by mouth daily. 30 tablet 2  . Colchicine (MITIGARE) 0.6 MG CAPS Take 0.6 mg by mouth daily. (Patient not taking: Reported on 05/11/2016) 30 capsule 2   No current facility-administered medications on file prior to visit.     No Known Allergies   Objective:  BP 126/84 (BP  Location: Right Leg, Cuff Size: Normal)   Pulse 65   Temp 98.4 F (36.9 C) (Oral)   Resp 16   Ht 5\' 8"  (1.727 m)   Wt 148 lb 3.2 oz (67.2 kg)   SpO2 99%   BMI 22.53 kg/m   Physical Exam  Constitutional: He is oriented to person, place, and time and well-developed, well-nourished, and in no distress. No distress.  Cardiovascular: Normal rate, regular rhythm and normal heart sounds.   Pulmonary/Chest: Effort normal. No respiratory distress.  Musculoskeletal:  No erythema   Neurological: He is alert and oriented to person, place, and time. GCS score is 15.  Skin: Skin is warm and dry.  Psychiatric: Mood, memory, affect and judgment normal.  Vitals reviewed.   Assessment and Plan :  1. Gout involving toe of left foot, unspecified cause, unspecified chronicity 2. Left toe pain - indomethacin (INDOCIN) 50 MG capsule; Take 1 capsule (50 mg total) by mouth 2 (two) times daily with a meal.  Dispense: 10 capsule; Refill: 1 - Will treat with indomethacin. Gout prevention guidelines discussed. RTC if no improvement/symptoms worsen.    Marco CollieWhitney Latayvia Mandujano, PA-C  [facility] 05/11/2016 2:15 PM

## 2016-07-06 ENCOUNTER — Other Ambulatory Visit: Payer: Self-pay | Admitting: Family Medicine

## 2016-07-13 ENCOUNTER — Other Ambulatory Visit: Payer: Self-pay | Admitting: Family Medicine

## 2017-03-06 ENCOUNTER — Other Ambulatory Visit: Payer: Self-pay | Admitting: Family Medicine

## 2017-05-01 ENCOUNTER — Ambulatory Visit (INDEPENDENT_AMBULATORY_CARE_PROVIDER_SITE_OTHER): Payer: BLUE CROSS/BLUE SHIELD | Admitting: Physician Assistant

## 2017-05-01 ENCOUNTER — Encounter: Payer: Self-pay | Admitting: Physician Assistant

## 2017-05-01 VITALS — BP 120/80 | HR 75 | Temp 98.8°F | Resp 16 | Ht 68.0 in | Wt 150.0 lb

## 2017-05-01 DIAGNOSIS — Z1159 Encounter for screening for other viral diseases: Secondary | ICD-10-CM

## 2017-05-01 DIAGNOSIS — M1A9XX0 Chronic gout, unspecified, without tophus (tophi): Secondary | ICD-10-CM

## 2017-05-01 DIAGNOSIS — Z1329 Encounter for screening for other suspected endocrine disorder: Secondary | ICD-10-CM

## 2017-05-01 DIAGNOSIS — Z0000 Encounter for general adult medical examination without abnormal findings: Secondary | ICD-10-CM

## 2017-05-01 DIAGNOSIS — M109 Gout, unspecified: Secondary | ICD-10-CM | POA: Diagnosis not present

## 2017-05-01 DIAGNOSIS — Z13220 Encounter for screening for lipoid disorders: Secondary | ICD-10-CM | POA: Diagnosis not present

## 2017-05-01 DIAGNOSIS — Z130 Encounter for screening for diseases of the blood and blood-forming organs and certain disorders involving the immune mechanism: Secondary | ICD-10-CM | POA: Diagnosis not present

## 2017-05-01 LAB — POCT URINALYSIS DIP (MANUAL ENTRY)
Bilirubin, UA: NEGATIVE
Glucose, UA: NEGATIVE mg/dL
Ketones, POC UA: NEGATIVE mg/dL
Leukocytes, UA: NEGATIVE
Nitrite, UA: NEGATIVE
Protein Ur, POC: NEGATIVE mg/dL
Spec Grav, UA: 1.025 (ref 1.010–1.025)
Urobilinogen, UA: 0.2 U/dL
pH, UA: 6 (ref 5.0–8.0)

## 2017-05-01 LAB — POC MICROSCOPIC URINALYSIS (UMFC): Mucus: ABSENT

## 2017-05-01 MED ORDER — COLCHICINE 0.6 MG PO TABS: 1.2000 mg | ORAL_TABLET | ORAL | 0 refills | Status: DC | PRN

## 2017-05-01 NOTE — Progress Notes (Signed)
[facility] at [place], [place] [address] [phone]  Date:  05/01/2017   Name:  Joel Hughes   DOB:  11-10-1960   MRN:  [id number]  PCP:  Patient, No Pcp Per    Chief Complaint: Annual Exam   History of Present Illness:  This is a pleasant 56 y.o. male with PMH HLD, seasonal allergies, insomnia who is presenting for CPE. Last annual 10/2015 with Dr. Carlota Raspberry. He works as an Building surveyor. He moved here from [place] 15 years ago. He graduated [employer] in Engineering 2 years ago. He's applying for engineering jobs in [place].  He is fasting today.   HLD - Currently on diet and exercise treatment only.  Seasonal allergies - leads to cough and sore throat. He eats pepermint to help. No allergy medicine.   Gout - last flare three years ago. He made dietary changes and has had fewer flares.    Complaints:  None. He is feeling well today.  Immunizations: tdap 2013.  Dentist: does not get checks. Brushes twice daily and flosses.  Eye: wears glasses. 20/20 Diet: heathy Exercise: every Friday.  Fam hx: all healthy.  Sexual hx: married Urinary hesitancy/frequency or nocturia: Yes. Negative PSA 10/2015 Problems with erectile dysfunction:  Tobacco/alcohol/substance use: Never smoker, no drug use, no alcohol.   Colonoscopy: never. Declines today.   Review of Systems:  Review of Systems  Constitutional: Negative for activity change, appetite change and fatigue.  HENT: Positive for sore throat. Negative for congestion, dental problem, sneezing and tinnitus.   Eyes: Negative for visual disturbance.  Respiratory: Negative for cough, chest tightness, shortness of breath and wheezing.   Cardiovascular: Negative for chest pain, palpitations and leg swelling.  Gastrointestinal: Negative for abdominal pain, blood in stool, constipation, diarrhea, nausea and vomiting.  Endocrine: Negative for polydipsia, polyphagia and polyuria.  Genitourinary: Negative for decreased  urine volume, difficulty urinating, discharge, hematuria, scrotal swelling and testicular pain.  Musculoskeletal: Negative for arthralgias, back pain and neck stiffness.  Allergic/Immunologic: Positive for environmental allergies and food allergies.  Neurological: Negative for dizziness, syncope, weakness, light-headedness and headaches.  Psychiatric/Behavioral: Negative for sleep disturbance. The patient is not nervous/anxious.     Patient Active Problem List   Diagnosis Date Noted  . Great toe pain, left 04/11/2016  . Hypertriglyceridemia 11/02/2013  . Voice hoarseness 10/18/2011    Prior to Admission medications   Medication Sig Start Date End Date Taking? Authorizing Provider  allopurinol (ZYLOPRIM) 100 MG tablet TAKE 1 TABLET (100 MG TOTAL) BY MOUTH DAILY. Patient not taking: Reported on 05/01/2017 07/13/16   Timmothy Euler, MD  Colchicine (MITIGARE) 0.6 MG CAPS Take 0.6 mg by mouth daily. Patient not taking: Reported on 05/11/2016 04/25/16   Timmothy Euler, MD  indomethacin (INDOCIN) 50 MG capsule Take 1 capsule (50 mg total) by mouth 2 (two) times daily with a meal. Patient not taking: Reported on 05/01/2017 05/11/16   Mabeline Varas, Gelene Mink, PA-C    No Known Allergies  No past surgical history on file.  Social History  Substance Use Topics  . Smoking status: Never Smoker  . Smokeless tobacco: Never Used  . Alcohol use No    No family history on file.  Medication list has been reviewed and updated.  Physical Examination:  Physical Exam  Constitutional: He is oriented to person, place, and time. He appears well-developed and well-nourished. No distress.  HENT:  Head: Normocephalic and atraumatic.  Right Ear: External ear normal.  Left  Ear: External ear normal.  Nose: Nose normal.  Mouth/Throat: No oropharyngeal exudate.  Eyes: Pupils are equal, round, and reactive to light. Conjunctivae and EOM are normal.  Neck: Normal range of motion. No thyromegaly  present.  Cardiovascular: Normal rate, regular rhythm, normal heart sounds and intact distal pulses.   No murmur heard. Pulmonary/Chest: Effort normal and breath sounds normal. No respiratory distress. He has no wheezes.  Abdominal: Soft. Bowel sounds are normal. He exhibits no distension and no mass. There is no tenderness.  Genitourinary:  Genitourinary Comments: Decline DRE  Musculoskeletal: Normal range of motion. He exhibits no edema.  Lymphadenopathy:    He has no cervical adenopathy.  Neurological: He is alert and oriented to person, place, and time. He has normal reflexes.  Skin: Skin is warm and dry.  Psychiatric: He has a normal mood and affect. His behavior is normal. Judgment and thought content normal.  Vitals reviewed.   BP 120/80   Pulse 75   Temp 98.8 F (37.1 C) (Oral)   Resp 16   Ht '5\' 8"'$  (1.727 m)   Wt 150 lb (68 kg)   SpO2 99%   BMI 22.81 kg/m   Assessment and Plan: 1. Annual physical exam - Pt presents for annual exam. H/o gout and HLD - both controlled with diet/lifestyle. He declines colonoscopy, however I do not believe he fully understands what this is, information printed out for him. Labs are pending, will contact with results. RTC in 1 year for annual.  2. Chronic gout without tophus, unspecified cause, unspecified site 3. Gout involving toe of left foot, unspecified cause, unspecified chronicity - colchicine (COLCRYS) 0.6 MG tablet; Take 2 tablets (1.2 mg total) by mouth as needed. Take 1.2 mg at the first sign of flare, followed in 1 hour with a single dose of 0.6 mg  Dispense: 12 tablet; Refill: 0  4. Screening, lipid - Lipid panel  5. Screening for endocrine disorder - POCT urinalysis dipstick - POCT Microscopic Urinalysis (UMFC) - CMP14+EGFR  6. Need for hepatitis C screening test - Hepatitis C antibody  7. Screening for deficiency anemia - CBC  Mercer Pod, PA-C  [facility] [place] 05/01/2017 2:06  PM

## 2017-05-01 NOTE — Patient Instructions (Addendum)
Make an appointment with dentist for a routine teeth cleaning.  Please see below for information regarding colonoscopy. You can google Cologuard as an alternative option.  We will contact you with your lab results.   Thank you for coming in today. I hope you feel we met your needs.  Feel free to call PCP if you have any questions or further requests.  Please consider signing up for MyChart if you do not already have it, as this is a great way to communicate with me.  Best,  Whitney McVey, PA-C  Colorectal Cancer Screening Colorectal cancer screening is a group of tests used to check for colorectal cancer. Colorectal refers to your colon and rectum. Your colon and rectum are located at the end of your large intestine and carry your bowel movements out of your body. Why is colorectal cancer screening done? It is common for abnormal growths (polyps) to form in the lining of your colon, especially as you get older. These polyps can be cancerous or become cancerous. If colorectal cancer is found at an early stage, it is treatable. Who should be screened for colorectal cancer? Screening is recommended for all adults at average risk starting at age 42. Tests may be recommended every 1 to 10 years. Your health care provider may recommend earlier or more frequent screening if you have:  A history of colorectal cancer or polyps.  A family member with a history of colorectal cancer or polyps.  Inflammatory bowel disease, such as ulcerative colitis or Crohn disease.  A type of hereditary colon cancer syndrome.  Colorectal cancer symptoms.  Types of screening tests There are several types of colorectal screening tests. They include:  Guaiac-based fecal occult blood testing.  Fecal immunochemical test (FIT).  Stool DNA test.  Barium enema.  Virtual colonoscopy.  Sigmoidoscopy. During this test, a sigmoidoscope is used to examine your rectum and lower colon. A sigmoidoscope is a flexible  tube with a camera that is inserted through your anus into your rectum and lower colon.  Colonoscopy. During this test, a colonoscope is used to examine your entire colon. A colonoscope is a long, thin, flexible tube with a camera. This test examines your entire colon and rectum.  This information is not intended to replace advice given to you by your health care provider. Make sure you discuss any questions you have with your health care provider. Document Released: 12/06/2009 Document Revised: 01/26/2016 Document Reviewed: 09/24/2013 Elsevier Interactive Patient Education  2017 [employer].   IF you received an x-ray today, you will receive an invoice from [facility]. Please contact [facility] at [phone] with questions or concerns regarding your invoice.   IF you received labwork today, you will receive an invoice from [facility]. Please contact [facility] at [phone] with questions or concerns regarding your invoice.   Our billing staff will not be able to assist you with questions regarding bills from these companies.  You will be contacted with the lab results as soon as they are available. The fastest way to get your results is to activate your My Chart account. Instructions are located on the last page of this paperwork. If you have not heard from [place] regarding the results in 2 weeks, please contact this office.

## 2017-05-02 ENCOUNTER — Encounter: Payer: Self-pay | Admitting: Physician Assistant

## 2017-05-02 LAB — CMP14+EGFR
ALT: 22 IU/L (ref 0–44)
AST: 21 IU/L (ref 0–40)
Albumin/Globulin Ratio: 1.5 (ref 1.2–2.2)
Albumin: 4.6 g/dL (ref 3.5–5.5)
Alkaline Phosphatase: 53 IU/L (ref 39–117)
BUN/Creatinine Ratio: 16 (ref 9–20)
BUN: 18 mg/dL (ref 6–24)
Bilirubin Total: 0.4 mg/dL (ref 0.0–1.2)
CO2: 26 mmol/L (ref 20–29)
Calcium: 9.8 mg/dL (ref 8.7–10.2)
Chloride: 98 mmol/L (ref 96–106)
Creatinine, Ser: 1.14 mg/dL (ref 0.76–1.27)
GFR calc Af Amer: 83 mL/min/{1.73_m2} (ref >59)
GFR calc non Af Amer: 72 mL/min/{1.73_m2} (ref >59)
Globulin, Total: 3.1 g/dL (ref 1.5–4.5)
Glucose: 82 mg/dL (ref 65–99)
Potassium: 4.3 mmol/L (ref 3.5–5.2)
Sodium: 140 mmol/L (ref 134–144)
Total Protein: 7.7 g/dL (ref 6.0–8.5)

## 2017-05-02 LAB — CBC
Hematocrit: 44.1 % (ref 37.5–51.0)
Hemoglobin: 15.2 g/dL (ref 13.0–17.7)
MCH: 32.1 pg (ref 26.6–33.0)
MCHC: 34.5 g/dL (ref 31.5–35.7)
MCV: 93 fL (ref 79–97)
Platelets: 271 10*3/uL (ref 150–379)
RBC: 4.73 x10E6/uL (ref 4.14–5.80)
RDW: 14.6 % (ref 12.3–15.4)
WBC: 6 10*3/uL (ref 3.4–10.8)

## 2017-05-02 LAB — LIPID PANEL
Chol/HDL Ratio: 4.8 ratio (ref 0.0–5.0)
Cholesterol, Total: 212 mg/dL — ABNORMAL HIGH (ref 100–199)
HDL: 44 mg/dL (ref >39)
LDL Calculated: 139 mg/dL — ABNORMAL HIGH (ref 0–99)
Triglycerides: 146 mg/dL (ref 0–149)
VLDL Cholesterol Cal: 29 mg/dL (ref 5–40)

## 2017-05-02 LAB — HEPATITIS C ANTIBODY: Hep C Virus Ab: <0.1 s/co ratio (ref 0.0–0.9)

## 2017-05-02 NOTE — Progress Notes (Signed)
Recommend improved lifestyle for slightly elevated cholesterol. Recheck UA at next OV. Consider urology referral if con't hematuria.

## 2017-06-10 ENCOUNTER — Other Ambulatory Visit: Payer: Self-pay | Admitting: Physician Assistant

## 2017-06-10 DIAGNOSIS — M109 Gout, unspecified: Secondary | ICD-10-CM

## 2017-12-12 ENCOUNTER — Ambulatory Visit (INDEPENDENT_AMBULATORY_CARE_PROVIDER_SITE_OTHER): Payer: BLUE CROSS/BLUE SHIELD | Admitting: Urgent Care

## 2017-12-12 ENCOUNTER — Ambulatory Visit (INDEPENDENT_AMBULATORY_CARE_PROVIDER_SITE_OTHER): Payer: BLUE CROSS/BLUE SHIELD

## 2017-12-12 ENCOUNTER — Other Ambulatory Visit: Payer: Self-pay

## 2017-12-12 ENCOUNTER — Encounter: Payer: Self-pay | Admitting: Urgent Care

## 2017-12-12 VITALS — BP 122/80 | HR 69 | Temp 98.2°F | Resp 16 | Ht 68.0 in | Wt 141.0 lb

## 2017-12-12 DIAGNOSIS — G8929 Other chronic pain: Secondary | ICD-10-CM

## 2017-12-12 DIAGNOSIS — R0683 Snoring: Secondary | ICD-10-CM | POA: Diagnosis not present

## 2017-12-12 DIAGNOSIS — R140 Abdominal distension (gaseous): Secondary | ICD-10-CM | POA: Diagnosis not present

## 2017-12-12 DIAGNOSIS — M545 Low back pain, unspecified: Secondary | ICD-10-CM

## 2017-12-12 DIAGNOSIS — R0600 Dyspnea, unspecified: Secondary | ICD-10-CM

## 2017-12-12 DIAGNOSIS — R5381 Other malaise: Secondary | ICD-10-CM | POA: Diagnosis not present

## 2017-12-12 DIAGNOSIS — R197 Diarrhea, unspecified: Secondary | ICD-10-CM

## 2017-12-12 MED ORDER — LOPERAMIDE HCL 2 MG PO CAPS
2.0000 mg | ORAL_CAPSULE | ORAL | 0 refills | Status: DC | PRN
Start: 2017-12-12 — End: 2018-02-17

## 2017-12-12 MED ORDER — MELOXICAM 7.5 MG PO TABS: 7.5000 mg | ORAL_TABLET | Freq: Every day | ORAL | 0 refills | Status: DC

## 2017-12-12 MED ORDER — CYCLOBENZAPRINE HCL 5 MG PO TABS: 5.0000 mg | ORAL_TABLET | Freq: Every day | ORAL | 1 refills | Status: DC

## 2017-12-12 NOTE — Progress Notes (Signed)
    MRN: [id number] DOB: 04/12/1961  Subjective:   Joel Hughes is a 57 y.o. male presenting for 3-day history of loose stools/diarrhea since returning from travel for the past 11 days.  Patient has been having 2 bowel movements per day, decreased appetite, belly bloating.  He has not tried any medications for relief.  He is hydrating well still.  Denies fever, nausea, vomiting, bloody stools.  He is also complaining of long-standing history of low back pain.  He is previously seen a chiropractor for this which did not help.  Denies any recent falls or trauma.  Reports that his back pain can be aggravated by prolonged standing or prolonged sitting.  He would also like to have a work-up for sleep apnea as he reports episodes of paroxysmal nocturnal dyspnea, snoring and daytime fatigue.  Denies smoking cigarettes or drinking alcohol.  [name] is not currently taking any medications.  Also has No Known Allergies.  [name]  has a past medical history of Allergy and Seizures (HCC). Also denies past surgical history.  Objective:   Vitals: BP 122/80   Pulse 69   Temp 98.2 F (36.8 C) (Oral)   Resp 16   Ht 5\' 8"  (1.727 m)   Wt 141 lb (64 kg)   SpO2 99%   BMI 21.44 kg/m   Physical Exam  Constitutional: He is oriented to person, place, and time. He appears well-developed and well-nourished.  HENT:  Mouth/Throat: Oropharynx is clear and moist.  Eyes: No scleral icterus.  Cardiovascular: Normal rate, regular rhythm and intact distal pulses. Exam reveals no gallop and no friction rub.  No murmur heard. Pulmonary/Chest: No respiratory distress. He has no wheezes. He has no rales.  Abdominal: Soft. Bowel sounds are normal. He exhibits no distension and no mass. There is no tenderness. There is no rebound and no guarding.  Musculoskeletal:       Lumbar back: He exhibits tenderness (over paraspinal muscles) and spasm. He exhibits normal range of motion, no bony tenderness, no swelling, no  edema and no deformity.  Neurological: He is alert and oriented to person, place, and time. He displays normal reflexes. Coordination normal.  Skin: Skin is warm and dry.  Psychiatric: He has a normal mood and affect.    Assessment and Plan :   Diarrhea, unspecified type - Plan: Stool culture, Basic metabolic panel  Malaise - Plan: Basic metabolic panel  Abdominal bloating - Plan: Basic metabolic panel  Chronic bilateral low back pain without sciatica - Plan: DG Lumbar Spine Complete  Paroxysmal nocturnal dyspnea - Plan: Ambulatory referral to Sleep Studies  Snoring - Plan: Ambulatory referral to Sleep Studies  Will start conservative management for his loose stools which is likely related to his travel.  Stool culture is pending.  Patient is to use Imodium, dosing instructions reviewed with patient.  He is to continue to hydrate aggressively.  Radiology report pending, will follow up with results later today.  For now patient is okay to use meloxicam and Flexeril for conservative management.  Referral pending for sleep study.  Wallis BambergMario Ceriah Kohler, PA-C Primary Care at [facility] [phone] 12/12/2017  3:38 PM

## 2017-12-12 NOTE — Patient Instructions (Signed)
Diarrhea, Adult Diarrhea is frequent loose and watery bowel movements. Diarrhea can make you feel weak and cause you to become dehydrated. Dehydration can make you tired and thirsty, cause you to have a dry mouth, and decrease how often you urinate. Diarrhea typically lasts 2-3 days. However, it can last longer if it is a sign of something more serious. It is important to treat your diarrhea as told by your health care provider. Follow these instructions at home: Eating and drinking  Follow these recommendations as told by your health care provider:  Take an oral rehydration solution (ORS). This is a drink that is sold at pharmacies and retail stores.  Drink clear fluids, such as water, ice chips, diluted fruit juice, and low-calorie sports drinks.  Eat bland, easy-to-digest foods in small amounts as you are able. These foods include bananas, applesauce, rice, lean meats, toast, and crackers.  Avoid drinking fluids that contain a lot of sugar or caffeine, such as energy drinks, sports drinks, and soda.  Avoid alcohol.  Avoid spicy or fatty foods.  General instructions  Drink enough fluid to keep your urine clear or pale yellow.  Wash your hands often. If soap and water are not available, use hand sanitizer.  Make sure that all people in your household wash their hands well and often.  Take over-the-counter and prescription medicines only as told by your health care provider.  Rest at home while you recover.  Watch your condition for any changes.  Take a warm bath to relieve any burning or pain from frequent diarrhea episodes.  Keep all follow-up visits as told by your health care provider. This is important. Contact a health care provider if:  You have a fever.  Your diarrhea gets worse.  You have new symptoms.  You cannot keep fluids down.  You feel light-headed or dizzy.  You have a headache  You have muscle cramps. Get help right away if:  You have chest  pain.  You feel extremely weak or you faint.  You have bloody or black stools or stools that look like tar.  You have severe pain, cramping, or bloating in your abdomen.  You have trouble breathing or you are breathing very quickly.  Your heart is beating very quickly.  Your skin feels cold and clammy.  You feel confused.  You have signs of dehydration, such as: ? Dark urine, very little urine, or no urine. ? Cracked lips. ? Dry mouth. ? Sunken eyes. ? Sleepiness. ? Weakness. This information is not intended to replace advice given to you by your health care provider. Make sure you discuss any questions you have with your health care provider. Document Released: 06/08/2002 Document Revised: 10/27/2015 Document Reviewed: 02/22/2015 Elsevier Interactive Patient Education  2018 [employer].     Back Pain, Adult Many adults have back pain from time to time. Common causes of back pain include:  A strained muscle or ligament.  Wear and tear (degeneration) of the spinal disks.  Arthritis.  A hit to the back.  Back pain can be short-lived (acute) or last a long time (chronic). A physical exam, lab tests, and imaging studies may be done to find the cause of your pain. Follow these instructions at home: Managing pain and stiffness  Take over-the-counter and prescription medicines only as told by your health care provider.  If directed, apply heat to the affected area as often as told by your health care provider. Use the heat source that your health care  provider recommends, such as a moist heat pack or a heating pad. ? Place a towel between your skin and the heat source. ? Leave the heat on for 20-30 minutes. ? Remove the heat if your skin turns bright red. This is especially important if you are unable to feel pain, heat, or cold. You have a greater risk of getting burned.  If directed, apply ice to the injured area: ? Put ice in a plastic bag. ? Place a towel between  your skin and the bag. ? Leave the ice on for 20 minutes, 2-3 times a day for the first 2-3 days. Activity  Do not stay in bed. Resting more than 1-2 days can delay your recovery.  Take short walks on even surfaces as soon as you are able. Try to increase the length of time you walk each day.  Do not sit, drive, or stand in one place for more than 30 minutes at a time. Sitting or standing for long periods of time can put stress on your back.  Use proper lifting techniques. When you bend and lift, use positions that put less stress on your back: ? Lan[place]end your knees. ? Keep the load close to your body. ? Avoid twisting.  Exercise regularly as told by your health care provider. Exercising will help your back heal faster. This also helps prevent back injuries by keeping muscles strong and flexible.  Your health care provider may recommend that you see a physical therapist. This person can help you come up with a safe exercise program. Do any exercises as told by your physical therapist. Lifestyle  Maintain a healthy weight. Extra weight puts stress on your back and makes it difficult to have good posture.  Avoid activities or situations that make you feel anxious or stressed. Learn ways to manage anxiety and stress. One way to manage stress is through exercise. Stress and anxiety increase muscle tension and can make back pain worse. General instructions  Sleep on a firm mattress in a comfortable position. Try lying on your side with your knees slightly bent. If you lie on your back, put a pillow under your knees.  Follow your treatment plan as told by your health care provider. This may include: ? Cognitive or behavioral therapy. ? Acupuncture or massage therapy. ? Meditation or yoga. Contact a health care provider if:  You have pain that is not relieved with rest or medicine.  You have increasing pain going down into your legs or buttocks.  Your pain does not improve in 2  weeks.  You have pain at night.  You lose weight.  You have a fever or chills. Get help right away if:  You develop new bowel or bladder control problems.  You have unusual weakness or numbness in your arms or legs.  You develop nausea or vomiting.  You develop abdominal pain.  You feel faint. Summary  Many adults have back pain from time to time. A physical exam, lab tests, and imaging studies may be done to find the cause of your pain.  Use proper lifting techniques. When you bend and lift, use positions that put less stress on your back.  Take over-the-counter and prescription medicines and apply heat or ice as directed by your health care provider. This information is not intended to replace advice given to you by your health care provider. Make sure you discuss any questions you have with your health care provider. Document Released: 06/18/2005 Document Revised: 07/23/2016 Document Reviewed:  07/23/2016 Elsevier Interactive Patient Education  [employer].

## 2017-12-13 LAB — BASIC METABOLIC PANEL
BUN/Creatinine Ratio: 12 (ref 9–20)
BUN: 13 mg/dL (ref 6–24)
CHLORIDE: 102 mmol/L (ref 96–106)
CO2: 24 mmol/L (ref 20–29)
Calcium: 9.3 mg/dL (ref 8.7–10.2)
Creatinine, Ser: 1.07 mg/dL (ref 0.76–1.27)
GFR calc Af Amer: 89 mL/min/{1.73_m2} (ref >59)
GFR, EST NON AFRICAN AMERICAN: 77 mL/min/{1.73_m2} (ref >59)
GLUCOSE: 89 mg/dL (ref 65–99)
POTASSIUM: 3.5 mmol/L (ref 3.5–5.2)
SODIUM: 139 mmol/L (ref 134–144)

## 2017-12-20 ENCOUNTER — Ambulatory Visit (INDEPENDENT_AMBULATORY_CARE_PROVIDER_SITE_OTHER): Payer: BLUE CROSS/BLUE SHIELD | Admitting: Urgent Care

## 2017-12-20 ENCOUNTER — Other Ambulatory Visit: Payer: Self-pay

## 2017-12-20 ENCOUNTER — Encounter: Payer: Self-pay | Admitting: Urgent Care

## 2017-12-20 VITALS — BP 138/74 | HR 79 | Temp 97.9°F | Resp 16 | Ht 68.0 in | Wt 144.4 lb

## 2017-12-20 DIAGNOSIS — R5383 Other fatigue: Secondary | ICD-10-CM

## 2017-12-20 DIAGNOSIS — J029 Acute pharyngitis, unspecified: Secondary | ICD-10-CM | POA: Diagnosis not present

## 2017-12-20 LAB — POCT RAPID STREP A (OFFICE): Rapid Strep A Screen: NEGATIVE

## 2017-12-20 LAB — STOOL CULTURE: E COLI SHIGA TOXIN ASSAY: NEGATIVE

## 2017-12-20 NOTE — Progress Notes (Signed)
    MRN: [id number] DOB: 04/01/1961  Subjective:   Joel Hughes is a 57 y.o. male presenting for 1 day history of sore throat, fatigue, subjective fever, malaise.  Patient has taken meloxicam.  Denies sinus pain, sinus congestion, ear pain, chest pain, shortness of breath, wheezing.  His GI symptoms have resolved.  Denies smoking cigarettes.  Does not hydrate well.  Drinks less than 2 bottles of water per day.  Joel Hughes has a current medication list which includes the following prescription(s): cyclobenzaprine, loperamide, and meloxicam. Also has No Known Allergies.  Joel Hughes  has a past medical history of Allergy and Seizures (HCC). Also  has no past surgical history on file.  Objective:   Vitals: BP 138/74   Pulse 79   Temp 97.9 F (36.6 C) (Oral)   Resp 16   Ht 5\' 8"  (1.727 m)   Wt 144 lb 6.4 oz (65.5 kg)   SpO2 98%   BMI 21.96 kg/m   Physical Exam  Constitutional: He is oriented to person, place, and time. He appears well-developed and well-nourished.  HENT:  Right Ear: Tympanic membrane normal.  Left Ear: Tympanic membrane normal.  Mouth/Throat: No uvula swelling. No oropharyngeal exudate, posterior oropharyngeal edema or posterior oropharyngeal erythema.  Cardiovascular: Normal rate, regular rhythm and intact distal pulses. Exam reveals no gallop and no friction rub.  No murmur heard. Pulmonary/Chest: No respiratory distress. He has no wheezes. He has no rales.  Neurological: He is alert and oriented to person, place, and time.   Results for orders placed or performed in visit on 12/20/17 (from the past 24 hour(s))  POCT rapid strep A     Status: None   Collection Time: 12/20/17  8:56 AM  Result Value Ref Range   Rapid Strep A Screen Negative Negative    Assessment and Plan :   Sore throat - Plan: POCT rapid strep A, Culture, Group A Strep  Other fatigue  Likely viral in etiology d/t reassuring physical exam findings. Advised supportive care, offered  symptomatic relief. Return-to-clinic precautions discussed, patient verbalized understanding.  We did review his stool culture results which were negative.  Referral for sleep study is pending.  Counseled that the fatigue may be coming from possible sleep apnea.  Wrote a work note for patient to go back to work on Monday, let patient know that this does not qualify for disability or FMLA.  Patient verbalized understanding.  Wallis BambergMario Kongmeng Santoro, PA-C Primary Care at [facility] [phone] 12/20/2017  8:55 AM

## 2017-12-20 NOTE — Patient Instructions (Addendum)
Hydrate well with at least 2 liters (1 gallon) of water daily. For sore throat try using a honey-based tea. Use 3 teaspoons of honey with juice squeezed from half lemon. Place shaved pieces of ginger into 1/2-1 cup of water and warm over stove top. Then mix the ingredients and repeat every 4 hours as needed. You may take 500mg  Tylenol every 6 hours for pain and inflammation.     Sore Throat When you have a sore throat, your throat may:  Hurt.  Burn.  Feel irritated.  Feel scratchy.  Many things can cause a sore throat, including:  An infection.  Allergies.  Dryness in the air.  Smoke or pollution.  Gastroesophageal reflux disease (GERD).  A tumor.  A sore throat can be the first sign of another sickness. It can happen with other problems, like coughing or a fever. Most sore throats go away without treatment. Follow these instructions at home:  Take over-the-counter medicines only as told by your doctor.  Drink enough fluids to keep your pee (urine) clear or pale yellow.  Rest when you feel you need to.  To help with pain, try: ? Sipping warm liquids, such as broth, herbal tea, or warm water. ? Eating or drinking cold or frozen liquids, such as frozen ice pops. ? Gargling with a salt-water mixture 3-4 times a day or as needed. To make a salt-water mixture, add -1 tsp of salt in 1 cup of warm water. Mix it until you cannot see the salt anymore. ? Sucking on hard candy or throat lozenges. ? Putting a cool-mist humidifier in your bedroom at night. ? Sitting in the bathroom with the door closed for 5-10 minutes while you run hot water in the shower.  Do not use any tobacco products, such as cigarettes, chewing tobacco, and e-cigarettes. If you need help quitting, ask your doctor. Contact a doctor if:  You have a fever for more than 2-3 days.  You keep having symptoms for more than 2-3 days.  Your throat does not get better in 7 days.  You have a fever and your  symptoms suddenly get worse. Get help right away if:  You have trouble breathing.  You cannot swallow fluids, soft foods, or your saliva.  You have swelling in your throat or neck that gets worse.  You keep feeling like you are going to throw up (vomit).  You keep throwing up. This information is not intended to replace advice given to you by your health care provider. Make sure you discuss any questions you have with your health care provider. Document Released: 03/27/2008 Document Revised: 02/12/2016 Document Reviewed: 04/08/2015 Elsevier Interactive Patient Education  2018 [employer]     IF you received an x-ray today, you will receive an invoice from [facility]. Please contact [facility] at [phone] with questions or concerns regarding your invoice.   IF you received labwork today, you will receive an invoice from [facility]. Please contact [facility] at [phone] with questions or concerns regarding your invoice.   Our billing staff will not be able to assist you with questions regarding bills from these companies.  You will be contacted with the lab results as soon as they are available. The fastest way to get your results is to activate your My Chart account. Instructions are located on the last page of this paperwork. If you have not heard from us regarding the results in 2 weeks, please contact this office.

## 2017-12-23 LAB — CULTURE, GROUP A STREP: Strep A Culture: NEGATIVE

## 2017-12-24 ENCOUNTER — Encounter: Payer: Self-pay | Admitting: Urgent Care

## 2018-01-08 ENCOUNTER — Other Ambulatory Visit: Payer: Self-pay | Admitting: Urgent Care

## 2018-02-17 ENCOUNTER — Encounter: Payer: Self-pay | Admitting: Neurology

## 2018-02-17 ENCOUNTER — Ambulatory Visit (INDEPENDENT_AMBULATORY_CARE_PROVIDER_SITE_OTHER): Payer: BLUE CROSS/BLUE SHIELD | Admitting: Neurology

## 2018-02-17 VITALS — BP 149/88 | HR 66 | Ht 66.0 in | Wt 147.0 lb

## 2018-02-17 DIAGNOSIS — R0683 Snoring: Secondary | ICD-10-CM

## 2018-02-17 DIAGNOSIS — R0689 Other abnormalities of breathing: Secondary | ICD-10-CM | POA: Diagnosis not present

## 2018-02-17 DIAGNOSIS — R51 Headache: Secondary | ICD-10-CM

## 2018-02-17 DIAGNOSIS — K219 Gastro-esophageal reflux disease without esophagitis: Secondary | ICD-10-CM

## 2018-02-17 DIAGNOSIS — R0681 Apnea, not elsewhere classified: Secondary | ICD-10-CM | POA: Diagnosis not present

## 2018-02-17 DIAGNOSIS — R519 Headache, unspecified: Secondary | ICD-10-CM

## 2018-02-17 NOTE — Patient Instructions (Signed)

## 2018-02-17 NOTE — Progress Notes (Signed)
Subjective:    Patient ID: Joel Hughes is a 57 y.o. male.  HPI     Huston FoleySaima Keelie Zemanek, MD, PhD [facility] [address], [address] [address] [place], [place] [id number]  Dear Marquita PalmsMario,   I saw your patient, Joel Hughes, upon your kind request, in my neurologic clinic today for initial consultation of his sleep disorder, in particular, concern for underlying obstructive sleep apnea. The patient is unaccompanied today. As you know, Joel Hughes is a 57 year old right-handed gentleman with an underlying medical history of prior seizure (by chart review), arthritis and allergies, who reports snoring and gasping for air, wife has noted sudden jerking up from sleep and maybe pauses in his breathing. He has had allergy symptoms, saw an allergy specialist. He has had nighttime reflux but does not use any medication for reflux. He has had a raspy throat and raspy voice. He sleeps sometimes with a lozenge or candy in his mouth as I understand. I reviewed your office note from 12/20/2017. 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 He is a NS, no EtOH, occasional caffeine. He has no children. He has no night to night nocturia, occasional AM HAs.    His Past Medical History Is Significant For: Past Medical History:  Diagnosis Date  . Allergy   . Seizures (HCC)     His Past Surgical History Is Significant For: No past surgical history on file.  His Family History Is Significant For: No family history on file.  His Social History Is Significant For: Social History   Socioeconomic History  . Marital status: Married    Spouse name: Not on file  . Number of children: Not on file  . Years of education: Not on file  . Highest education level: Not on file  Occupational History  . Not on file  Social Needs  . Financial resource strain: Not on file  . Food insecurity:    Worry: Not on file    Inability: Not on file  . Transportation needs:     Medical: Not on file    Non-medical: Not on file  Tobacco Use  . Smoking status: Never Smoker  . Smokeless tobacco: Never Used  Substance and Sexual Activity  . Alcohol use: No  . Drug use: No  . Sexual activity: Never  Lifestyle  . Physical activity:    Days per week: Not on file    Minutes per session: Not on file  . Stress: Not on file  Relationships  . Social connections:    Talks on phone: Not on file    Gets together: Not on file    Attends religious service: Not on file    Active member of club or organization: Not on file    Attends meetings of clubs or organizations: Not on file    Relationship status: Not on file  Other Topics Concern  . Not on file  Social History Narrative  . Not on file    His Allergies Are:  No Known Allergies:   His Current Medications Are:  Outpatient Encounter Medications as of 02/17/2018  Medication Sig  . [DISCONTINUED] cyclobenzaprine (FLEXERIL) 5 MG tablet Take 1 tablet (5 mg total) by mouth at bedtime.  . [DISCONTINUED] loperamide (IMODIUM) 2 MG capsule Take 1 capsule (2 mg total) by mouth as needed for diarrhea or loose stools.  . [DISCONTINUED] meloxicam (MOBIC) 7.5 MG tablet TAKE 1 TABLET BY MOUTH EVERY  DAY   No facility-administered encounter medications on file as of 02/17/2018.   :  Review of Systems:  Out of a complete 14 point review of systems, all are reviewed and negative with the exception of these symptoms as listed below:  Review of Systems  Neurological:       Pt presents today to discuss his sleep. Pt has never had a sleep study but does endorse snoring.  Epworth Sleepiness Scale 0= would never doze 1= slight chance of dozing 2= moderate chance of dozing 3= high chance of dozing  Sitting and reading: 0 Watching TV: 0 Sitting inactive in a public place (ex. Theater or meeting): 0 As a passenger in a car for an hour without a break: 0 Lying down to rest in the afternoon: 0 Sitting and talking to someone:  0 Sitting quietly after lunch (no alcohol): 0 In a car, while stopped in traffic: 0 Total: 0     Objective:  Neurological Exam  Physical Exam Physical Examination:   Vitals:   02/17/18 1321  BP: (!) 149/88  Pulse: 66    General Examination: The patient is a very pleasant 57 y.o. male in no acute distress. He appears well-developed and well-nourished and well groomed.   HEENT: Normocephalic, atraumatic, pupils are equal, round and reactive to light and accommodation. Funduscopic exam is normal with sharp disc margins noted. Extraocular tracking is good without limitation to gaze excursion or nystagmus noted. Normal smooth pursuit is noted. Hearing is grossly intact. Tympanic membranes are clear bilaterally. Face is symmetric with normal facial animation and normal facial sensation. Speech is clear with no dysarthria noted. There is no hypophonia. There is no lip, neck/head, jaw or voice tremor. Neck is supple with full range of passive and active motion. There are no carotid bruits on auscultation. Oropharynx exam reveals: mild mouth dryness, adequate dental hygiene and moderate airway crowding, due to small airway opening, tonsils 2+ and redundant soft palate. Mallampati is class II. Tongue protrudes centrally and palate elevates symmetrically. Raspy voice. Neck size is 15 inches. He has a Mild overbite. Nasal inspection reveals no significant nasal mucosal bogginess or redness and no septal deviation.   Chest: Clear to auscultation without wheezing, rhonchi or crackles noted.  Heart: S1+S2+0, regular and normal without murmurs, rubs or gallops noted.   Abdomen: Soft, non-tender and non-distended with normal bowel sounds appreciated on auscultation.  Extremities: There is no pitting edema in the distal lower extremities bilaterally.   Skin: Warm and dry without trophic changes noted.   Musculoskeletal: exam reveals no obvious joint deformities, tenderness or joint swelling or  erythema.   Neurologically:  Mental status: The patient is awake, alert and oriented in all 4 spheres. His immediate and remote memory, attention, language skills and fund of knowledge are appropriate. There is no evidence of aphasia, agnosia, apraxia or anomia. Speech is clear with normal prosody and enunciation. Thought process is linear. Mood is normal and affect is normal.  Cranial nerves II - XII are as described above under HEENT exam. In addition: shoulder shrug is normal with equal shoulder height noted. Motor exam: Normal bulk, strength and tone is noted. There is no drift, tremor or rebound. Romberg is negative. Reflexes are 2+ throughout. Fine motor skills and coordination: intact with normal finger taps, normal hand movements, normal rapid alternating patting, normal foot taps and normal foot agility.  Cerebellar testing: No dysmetria or intention tremor on finger to nose testing. Heel to shin is unremarkable bilaterally.  There is no truncal or gait ataxia.  Sensory exam: intact to light touch in the upper and lower extremities.  Gait, station and balance: He stands easily. No veering to one side is noted. No leaning to one side is noted. Posture is age-appropriate and stance is narrow based. Gait shows normal stride length and normal pace. No problems turning. Tandem walk is unremarkable.              Assessment and Plan:  In summary, Joel Hughes is a very pleasant 57 y.o.-year old male with an underlying medical history of prior seizure (by chart review), arthritis and allergies, whose history and physical exam are concerning for obstructive sleep apnea (OSA). I had a long chat with the patient about my findings and the diagnosis of OSA, its prognosis and treatment options. We talked about medical treatments, surgical interventions and non-pharmacological approaches. I explained in particular the risks and ramifications of untreated moderate to severe OSA, especially with respect to  developing cardiovascular disease down the Road, including congestive heart failure, difficult to treat hypertension, cardiac arrhythmias, or stroke. Even type 2 diabetes has, in part, been linked to untreated OSA. Symptoms of untreated OSA include daytime sleepiness, memory problems, mood irritability and mood disorder such as depression and anxiety, lack of energy, as well as recurrent headaches, especially morning headaches. We talked about trying to maintain a healthy lifestyle in general, as well as the importance of weight control. I encouraged the patient to eat healthy, exercise daily and keep well hydrated, to keep a scheduled bedtime and wake time routine, to not skip any meals and eat healthy snacks in between meals. I advised the patient not to drive when feeling sleepy.  He is advised to sleep with a lozenge in his mouth. This can make his reflux worse and of course is a choking hazard.  I recommended the following at this time: sleep study with potential positive airway pressure titration. (We will score hypopneas at 3%).   I explained the sleep test procedure to the patient and also outlined possible surgical and non-surgical treatment options of OSA, including the use of a custom-made dental device (which would require a referral to a specialist dentist or oral surgeon), upper airway surgical options, such as pillar implants, radiofrequency surgery, tongue base surgery, and UPPP (which would involve a referral to an ENT surgeon). Rarely, jaw surgery such as mandibular advancement may be considered.  I also explained the CPAP treatment option to the patient, who indicated that he would be willing to try CPAP if the need arises. I explained the importance of being compliant with PAP treatment, not only for insurance purposes but primarily to improve His symptoms, and for the patient's long term health benefit, including to reduce His cardiovascular risks. I answered all his questions today and  the patient was in agreement. I plan to see him back after the sleep study is completed and encouraged him to call with any interim questions, concerns, problems or updates.   Thank you very much for allowing me to participate in the care of this nice patient. If I can be of any further assistance to you please do not hesitate to call me at [phone].  Sincerely,   Huston Foley, MD, PhD

## 2018-03-06 ENCOUNTER — Ambulatory Visit (INDEPENDENT_AMBULATORY_CARE_PROVIDER_SITE_OTHER): Payer: BLUE CROSS/BLUE SHIELD | Admitting: Neurology

## 2018-03-06 DIAGNOSIS — G4733 Obstructive sleep apnea (adult) (pediatric): Secondary | ICD-10-CM

## 2018-03-06 DIAGNOSIS — R51 Headache: Secondary | ICD-10-CM

## 2018-03-06 DIAGNOSIS — R0689 Other abnormalities of breathing: Secondary | ICD-10-CM

## 2018-03-06 DIAGNOSIS — R0681 Apnea, not elsewhere classified: Secondary | ICD-10-CM

## 2018-03-06 DIAGNOSIS — R0683 Snoring: Secondary | ICD-10-CM

## 2018-03-06 DIAGNOSIS — R519 Headache, unspecified: Secondary | ICD-10-CM

## 2018-03-06 DIAGNOSIS — K219 Gastro-esophageal reflux disease without esophagitis: Secondary | ICD-10-CM

## 2018-03-11 ENCOUNTER — Telehealth: Payer: Self-pay

## 2018-03-11 NOTE — Telephone Encounter (Signed)
-----   Message from Huston Foley, MD sent at 03/11/2018  7:53 AM EDT ----- Patient referred by Wallis Bamberg, PA, seen by me on 02/17/18, diagnostic PSG on 03/06/18.   Please call and notify the patient that the recent sleep study showed mild obstructive sleep apnea, severe in REM sleep with a total AHI of 12.6/hour, REM AHI of 35.4/hour, and O2 nadir of 77%. Given the desaturations in REM sleep into the lower 80s and higher 70s, treatment with positive airway pressure is advised. I recommend treatment for this in the form of CPAP. This will require a repeat sleep study for proper titration and mask fitting and correct monitoring of the oxygen saturations. Please explain to patient. I have placed an order in the chart. Thanks.  Huston Foley, MD, PhD [facility]

## 2018-03-11 NOTE — Progress Notes (Signed)
Patient referred by Wallis Bamberg, PA, seen by me on 02/17/18, diagnostic PSG on 03/06/18.   Please call and notify the patient that the recent sleep study showed mild obstructive sleep apnea, severe in REM sleep with a total AHI of 12.6/hour, REM AHI of 35.4/hour, and O2 nadir of 77%. Given the desaturations in REM sleep into the lower 80s and higher 70s, treatment with positive airway pressure is advised. I recommend treatment for this in the form of CPAP. This will require a repeat sleep study for proper titration and mask fitting and correct monitoring of the oxygen saturations. Please explain to patient. I have placed an order in the chart. Thanks.  Huston Foley, MD, PhD [facility])

## 2018-03-11 NOTE — Addendum Note (Signed)
Addended by: Huston Foley on: 03/11/2018 07:53 AM   Modules accepted: Orders

## 2018-03-11 NOTE — Procedures (Signed)
PATIENT'S NAME:  Joel Hughes, Joel Hughes DOB:      July 02, 1961      MR#:    [id number]     DATE OF RECORDING: 03/06/2018 REFERRING M.D.:  Wallis Bamberg, PA-C Study Performed:   Baseline Polysomnogram HISTORY: 57 year old man with a history of prior seizure, arthritis and allergies, who reports snoring and gasping for air, wife has noted sudden jerking up from sleep and maybe pauses in his breathing. The patient's weight 147 pounds with a height of 66 (inches), resulting in a BMI of 23.7 kg/m2. The patient's neck circumference measured 15 inches.  CURRENT MEDICATIONS: None Listed.   PROCEDURE:  This is a multichannel digital polysomnogram utilizing the Somnostar 11.2 system.  Electrodes and sensors were applied and monitored per AASM Specifications.   EEG, EOG, Chin and Limb EMG, were sampled at 200 Hz.  ECG, Snore and Nasal Pressure, Thermal Airflow, Respiratory Effort, CPAP Flow and Pressure, Oximetry was sampled at 50 Hz. Digital video and audio were recorded.      BASELINE STUDY  Lights Out was at 21:50 and Lights On at 04:51.  Total recording time (TRT) was 421 minutes, with a total sleep time (TST) of  338 minutes.   The patient's sleep latency was 17.5 minutes, which is normal. REM latency was 71 minutes, which is normal.  The sleep efficiency was 80.3 %.     SLEEP ARCHITECTURE: WASO (Wake after sleep onset) was 65 minutes with mild sleep fragmentation noted and one longer period of wakefulness. There were 16 minutes in Stage N1, 202 minutes Stage N2, 50.5 minutes Stage N3 and 69.5 minutes in Stage REM.  The percentage of Stage N1 was 4.7%, Stage N2 was 59.8%, which is mildly increased, Stage N3 was 14.9% and Stage R (REM sleep) was 20.6%, which is normal.  The arousals were noted as: 50 were spontaneous, 0 were associated with PLMs, 22 were associated with respiratory events.  RESPIRATORY ANALYSIS:  There were a total of 71 respiratory events:  29 obstructive apneas, 0 central apneas and 0 mixed apneas  with a total of 29 apneas and an apnea index (AI) of 5.1 /hour. There were 42 hypopneas with a hypopnea index of 7.5 /hour. The patient also had 0 respiratory event related arousals (RERAs).      The total APNEA/HYPOPNEA INDEX (AHI) was 12.6/hour and the total RESPIRATORY DISTURBANCE INDEX was  12.6 /hour.  41 events occurred in REM sleep and 34 events in NREM. The REM AHI was 35.4/hour, versus a non-REM AHI of 6.7. The patient spent 240.5 minutes of total sleep time in the supine position and 98 minutes in non-supine. The supine AHI was 13.7 versus a non-supine AHI of 9.8.  OXYGEN SATURATION & C02:  The Wake baseline 02 saturation was 91%, with the lowest being 77%. Time spent below 89% saturation equaled 24 minutes.  PERIODIC LIMB MOVEMENTS: The patient had a total of 0 Periodic Limb Movements.  The Periodic Limb Movement (PLM) index was 0 and the PLM Arousal index was 0/hour.  Audio and video analysis did not show any abnormal or unusual movements, behaviors, phonations or vocalizations. The patient took no bathroom breaks. Moderate to loud snoring was noted. The EKG was in keeping with normal sinus rhythm (NSR).  Post-study, the patient indicated that sleep was worse than usual.   IMPRESSION:  1. Obstructive Sleep Apnea   RECOMMENDATIONS:  1. This study demonstrates overall mild obstructive sleep apnea, severe in REM sleep with a total AHI of 12.6/hour, REM  AHI of 35.4/hour, and O2 nadir of 77%. Given the patient's medical history and sleep related complaints, as well as desaturations in REM sleep into the lower 80s and higher 70s, treatment with positive airway pressure is advised; a full-night CPAP titration study is recommended to optimize therapy. Other treatment options may include avoidance of supine sleep position along with weight loss, upper airway or jaw surgery in selected patients or the use of an oral appliance in certain patients. ENT evaluation and/or consultation with a  maxillofacial surgeon or dentist may be feasible in some instances. Please note that untreated obstructive sleep apnea may carry additional perioperative morbidity. Patients with significant obstructive sleep apnea (typically, in the moderate to severe degree) should receive, if possible, perioperative PAP (positive airway pressure) therapy and the surgeons and particularly the anesthesiologists should be informed of the diagnosis and the severity of the sleep disordered breathing. If feasible, the patient may be asked to bring his/her CPAP or BiPAP machine for a planned/elective surgery.  2. The patient should be cautioned not to drive, work at heights, or operate dangerous or heavy equipment when tired or sleepy. Review and reiteration of good sleep hygiene measures should be pursued with any patient. 3. The patient will be seen in follow-up in the sleep clinic at [facility] for discussion of the test results, symptom and treatment compliance review, further management strategies, etc. The referring provider will be notified of the test results.  I certify that I have reviewed the entire raw data recording prior to the issuance of this report in accordance with the Standards of Accreditation of the American Academy of Sleep Medicine (AASM)   Huston Foley, MD, PhD Diplomat, American Board of Neurology and Sleep Medicine (Neurology and Sleep Medicine)

## 2018-03-11 NOTE — Telephone Encounter (Signed)
I called pt to discuss his sleep study results. No answer, left a message asking him to call me back. 

## 2018-03-12 NOTE — Telephone Encounter (Signed)
I called pt. I advised pt that Dr. Frances Furbish reviewed their sleep study results and found that pt has mild osa but severe osa in REM sleep with oxygen desaturations and recommends that pt be treated with a cpap. Dr. Frances Furbish recommends that pt return for a repeat sleep study in order to properly titrate the cpap and ensure a good mask fit. Pt is agreeable to returning for a titration study. I advised pt that our sleep lab will file with pt's insurance and call pt to schedule the sleep study when we hear back from the pt's insurance regarding coverage of this sleep study. Pt verbalized understanding of results. Pt had no questions at this time but was encouraged to call back if questions arise.  Pt asked that I mail him a copy of his results and I verified that his address on file is correct.

## 2018-03-27 ENCOUNTER — Telehealth: Payer: Self-pay

## 2018-03-27 NOTE — Telephone Encounter (Signed)
Pt cancelled CPAP study due to being sick.  Left 2 VMs for pt to call back and reschedule that appt.

## 2018-07-18 ENCOUNTER — Encounter: Payer: BLUE CROSS/BLUE SHIELD | Admitting: Family Medicine

## 2019-06-25 ENCOUNTER — Encounter: Payer: BLUE CROSS/BLUE SHIELD | Admitting: Emergency Medicine

## 2019-06-29 ENCOUNTER — Encounter: Payer: Self-pay | Admitting: Emergency Medicine

## 2019-06-29 ENCOUNTER — Encounter: Payer: BLUE CROSS/BLUE SHIELD | Admitting: Emergency Medicine

## 2019-06-30 ENCOUNTER — Encounter: Payer: Self-pay | Admitting: Emergency Medicine

## 2019-07-08 ENCOUNTER — Other Ambulatory Visit: Payer: Self-pay

## 2019-07-08 ENCOUNTER — Ambulatory Visit (INDEPENDENT_AMBULATORY_CARE_PROVIDER_SITE_OTHER): Payer: BC Managed Care – PPO | Admitting: Emergency Medicine

## 2019-07-08 ENCOUNTER — Encounter: Payer: Self-pay | Admitting: Emergency Medicine

## 2019-07-08 VITALS — BP 124/82 | HR 99 | Temp 98.4°F | Ht 66.0 in | Wt 156.8 lb

## 2019-07-08 DIAGNOSIS — K219 Gastro-esophageal reflux disease without esophagitis: Secondary | ICD-10-CM | POA: Diagnosis not present

## 2019-07-08 DIAGNOSIS — M109 Gout, unspecified: Secondary | ICD-10-CM

## 2019-07-08 DIAGNOSIS — Z7689 Persons encountering health services in other specified circumstances: Secondary | ICD-10-CM | POA: Diagnosis not present

## 2019-07-08 DIAGNOSIS — E559 Vitamin D deficiency, unspecified: Secondary | ICD-10-CM | POA: Diagnosis not present

## 2019-07-08 MED ORDER — COLCHICINE 0.6 MG PO TABS: ORAL_TABLET | ORAL | 1 refills | Status: DC

## 2019-07-08 MED ORDER — VITAMIN D (ERGOCALCIFEROL) 1.25 MG (50000 UNIT) PO CAPS: 50000.0000 [IU] | ORAL_CAPSULE | ORAL | 0 refills | Status: DC

## 2019-07-08 MED ORDER — PREDNISONE 20 MG PO TABS: 40.0000 mg | ORAL_TABLET | Freq: Every day | ORAL | 0 refills | Status: AC

## 2019-07-08 MED ORDER — PANTOPRAZOLE SODIUM 40 MG PO TBEC: 40.0000 mg | DELAYED_RELEASE_TABLET | Freq: Every day | ORAL | 3 refills | Status: DC

## 2019-07-08 NOTE — Progress Notes (Addendum)
Joel Hughes 59 y.o.   Chief Complaint  Patient presents with  . Establish Care    general health pt feels good.  . Foot Pain    Pt states the pain is a gout flair up in his L foot. foot appears swollen.    HISTORY OF PRESENT ILLNESS: This is a 59 y.o. male complaining of gout attack to left foot.  Last attack about 3 years ago.  No medications. Recent blood work shows severe vitamin D deficiency.  He shows mild abnormality of lipid profile, normal CBC, normal CMP and normal A1c.  Elevated PTH and normal urinalysis. Also has history of GERD with heartburn and chronic sore throat.  On no medications. No other complaints or medical concerns today. Wants to establish care with me.  HPI   Prior to Admission medications   Medication Sig Start Date End Date Taking? Authorizing Provider  colchicine 0.6 MG tablet Take 1.2 mg now and 0.6 mg one hour later. Take 0.6 mg daily after that x 5 days. 07/08/19   Georgina Quint, MD  pantoprazole (PROTONIX) 40 MG tablet Take 1 tablet (40 mg total) by mouth daily. 07/08/19   Georgina Quint, MD  predniSONE (DELTASONE) 20 MG tablet Take 2 tablets (40 mg total) by mouth daily with breakfast for 5 days. 07/08/19 07/13/19  Georgina Quint, MD  Vitamin D, Ergocalciferol, (DRISDOL) 1.25 MG (50000 UT) CAPS capsule Take 1 capsule (50,000 Units total) by mouth every 7 (seven) days. 07/08/19   Georgina Quint, MD    No Known Allergies  Patient Active Problem List   Diagnosis Date Noted  . Great toe pain, left 04/11/2016  . Hypertriglyceridemia 11/02/2013  . Voice hoarseness 10/18/2011    Past Medical History:  Diagnosis Date  . Allergy   . Seizures (HCC)     History reviewed. No pertinent surgical history.  Social History   Socioeconomic History  . Marital status: Married    Spouse name: Not on file  . Number of children: Not on file  . Years of education: Not on file  . Highest education level: Not on file  Occupational  History  . Not on file  Tobacco Use  . Smoking status: Never Smoker  . Smokeless tobacco: Never Used  Substance and Sexual Activity  . Alcohol use: No  . Drug use: No  . Sexual activity: Never  Other Topics Concern  . Not on file  Social History Narrative  . Not on file   Social Determinants of Health   Financial Resource Strain:   . Difficulty of Paying Living Expenses: Not on file  Food Insecurity:   . Worried About Programme researcher, broadcasting/film/video in the Last Year: Not on file  . Ran Out of Food in the Last Year: Not on file  Transportation Needs:   . Lack of Transportation (Medical): Not on file  . Lack of Transportation (Non-Medical): Not on file  Physical Activity:   . Days of Exercise per Week: Not on file  . Minutes of Exercise per Session: Not on file  Stress:   . Feeling of Stress : Not on file  Social Connections:   . Frequency of Communication with Friends and Family: Not on file  . Frequency of Social Gatherings with Friends and Family: Not on file  . Attends Religious Services: Not on file  . Active Member of Clubs or Organizations: Not on file  . Attends Banker Meetings: Not on file  .  Marital Status: Not on file  Intimate Partner Violence:   . Fear of Current or Ex-Partner: Not on file  . Emotionally Abused: Not on file  . Physically Abused: Not on file  . Sexually Abused: Not on file    History reviewed. No pertinent family history.   Review of Systems  Constitutional: Negative.  Negative for chills and fever.  HENT: Positive for sore throat.   Respiratory: Negative.  Negative for cough and shortness of breath.   Cardiovascular: Negative.  Negative for chest pain and palpitations.  Gastrointestinal: Positive for heartburn. Negative for abdominal pain, diarrhea, nausea and vomiting.  Genitourinary: Negative.   Musculoskeletal: Positive for joint pain.  Skin: Negative.   Neurological: Negative.  Negative for dizziness and headaches.  All other  systems reviewed and are negative.  Vitals:   07/08/19 1602  BP: 124/82  Pulse: 99  Temp: 98.4 F (36.9 C)  SpO2: 99%     Physical Exam Vitals reviewed.  Constitutional:      Appearance: Normal appearance.  HENT:     Head: Normocephalic.  Eyes:     Extraocular Movements: Extraocular movements intact.     Conjunctiva/sclera: Conjunctivae normal.     Pupils: Pupils are equal, round, and reactive to light.  Cardiovascular:     Rate and Rhythm: Normal rate and regular rhythm.     Heart sounds: Normal heart sounds.  Pulmonary:     Effort: Pulmonary effort is normal.     Breath sounds: Normal breath sounds.  Abdominal:     General: There is no distension.     Palpations: Abdomen is soft.     Tenderness: There is no abdominal tenderness.  Musculoskeletal:        General: Normal range of motion.     Cervical back: Normal range of motion and neck supple.  Skin:    General: Skin is warm and dry.     Capillary Refill: Capillary refill takes less than 2 seconds.     Comments: Left foot: Positive swelling, erythema, tenderness to first MTP joint compatible with gout flareup.  Neurological:     General: No focal deficit present.     Mental Status: He is alert and oriented to person, place, and time.  Psychiatric:        Mood and Affect: Mood normal.        Behavior: Behavior normal.    A total of 35 minutes was spent in the room with the patient, greater than 50% of which was in counseling/coordination of care regarding chronic medical conditions, review of most recent blood work, review of medications, review of new medications started today with side effects, diet and nutrition regarding gout, GERD and proper treatment with diet, prognosis and need for follow-up.   ASSESSMENT & PLAN: Labrandon was seen today for establish care and foot pain.  Diagnoses and all orders for this visit:  Acute gouty arthritis -     colchicine 0.6 MG tablet; Take 1.2 mg now and 0.6 mg one hour  later. Take 0.6 mg daily after that x 5 days. -     predniSONE (DELTASONE) 20 MG tablet; Take 2 tablets (40 mg total) by mouth daily with breakfast for 5 days.  Vitamin D deficiency -     Vitamin D, Ergocalciferol, (DRISDOL) 1.25 MG (50000 UT) CAPS capsule; Take 1 capsule (50,000 Units total) by mouth every 7 (seven) days.  Gastroesophageal reflux disease without esophagitis -     pantoprazole (PROTONIX) 40 MG tablet;  Take 1 tablet (40 mg total) by mouth daily.  Encounter to establish care    Patient Instructions       If you have lab work done today you will be contacted with your lab results within the next 2 weeks.  If you have not heard from [place] then please contact us. The fastest way to get your results is to register for My Chart.   IF you received an x-ray today, you will receive an invoice from [facility]. Please contact [facility] at [phone] with questions or concerns regarding your invoice.   IF you received labwork today, you will receive an invoice from [place]. Please contact [facility] at [phone] with questions or concerns regarding your invoice.   Our billing staff will not be able to assist you with questions regarding bills from these companies.  You will be contacted with the lab results as soon as they are available. The fastest way to get your results is to activate your My Chart account. Instructions are located on the last page of this paperwork. If you have not heard from [place] regarding the results in 2 weeks, please contact this office.     Gout  Gout is painful swelling of your joints. Gout is a type of arthritis. It is caused by having too much uric acid in your body. Uric acid is a chemical that is made when your body breaks down substances called purines. If your body has too much uric acid, sharp crystals can form and build up in your joints. This causes pain and swelling. Gout attacks can happen quickly and be very painful  (acute gout). Over time, the attacks can affect more joints and happen more often (chronic gout). What are the causes?  Too much uric acid in your blood. This can happen because: ? Your kidneys do not remove enough uric acid from your blood. ? Your body makes too much uric acid. ? You eat too many foods that are high in purines. These foods include organ meats, some seafood, and beer.  Trauma or stress. What increases the risk?  Having a family history of gout.  Being male and middle-aged.  Being male and having gone through menopause.  Being very overweight (obese).  Drinking alcohol, especially beer.  Not having enough water in the body (being dehydrated).  Losing weight too quickly.  Having an organ transplant.  Having lead poisoning.  Taking certain medicines.  Having kidney disease.  Having a skin condition called psoriasis. What are the signs or symptoms? An attack of acute gout usually happens in just one joint. The most common place is the big toe. Attacks often start at night. Other joints that may be affected include joints of the feet, ankle, knee, fingers, wrist, or elbow. Symptoms of an attack may include:  Very bad pain.  Warmth.  Swelling.  Stiffness.  Shiny, red, or purple skin.  Tenderness. The affected joint may be very painful to touch.  Chills and fever. Chronic gout may cause symptoms more often. More joints may be involved. You may also have white or yellow lumps (tophi) on your hands or feet or in other areas near your joints. How is this treated?  Treatment for this condition has two phases: treating an acute attack and preventing future attacks.  Acute gout treatment may include: ? NSAIDs. ? Steroids. These are taken by mouth or injected into a joint. ? Colchicine. This medicine relieves pain and swelling. It can be given by mouth  or through an IV tube.  Preventive treatment may include: ? Taking small doses of NSAIDs or  colchicine daily. ? Using a medicine that reduces uric acid levels in your blood. ? Making changes to your diet. You may need to see a food expert (dietitian) about what to eat and drink to prevent gout. Follow these instructions at home: During a gout attack   If told, put ice on the painful area: ? Put ice in a plastic bag. ? Place a towel between your skin and the bag. ? Leave the ice on for 20 minutes, 2-3 times a day.  Raise (elevate) the painful joint above the level of your heart as often as you can.  Rest the joint as much as possible. If the joint is in your leg, you may be given crutches.  Follow instructions from your doctor about what you cannot eat or drink. Avoiding future gout attacks  Eat a low-purine diet. Avoid foods and drinks such as: ? Liver. ? Kidney. ? Anchovies. ? Asparagus. ? Herring. ? Mushrooms. ? Mussels. ? Beer.  Stay at a healthy weight. If you want to lose weight, talk with your doctor. Do not lose weight too fast.  Start or continue an exercise plan as told by your doctor. Eating and drinking  Drink enough fluids to keep your pee (urine) pale yellow.  If you drink alcohol: ? Limit how much you use to:  0-1 drink a day for women.  0-2 drinks a day for men. ? Be aware of how much alcohol is in your drink. In the [place] one drink equals one 12 oz bottle of beer (355 mL), one 5 oz glass of wine (148 mL), or one 1 oz glass of hard liquor (44 mL). General instructions  Take over-the-counter and prescription medicines only as told by your doctor.  Do not drive or use heavy machinery while taking prescription pain medicine.  Return to your normal activities as told by your doctor. Ask your doctor what activities are safe for you.  Keep all follow-up visits as told by your doctor. This is important. Contact a doctor if:  You have another gout attack.  You still have symptoms of a gout attack after 10 days of treatment.  You have  problems (side effects) because of your medicines.  You have chills or a fever.  You have burning pain when you pee (urinate).  You have pain in your lower back or belly. Get help right away if:  You have very bad pain.  Your pain cannot be controlled.  You cannot pee. Summary  Gout is painful swelling of the joints.  The most common site of pain is the big toe, but it can affect other joints.  Medicines and avoiding some foods can help to prevent and treat gout attacks. This information is not intended to replace advice given to you by your health care provider. Make sure you discuss any questions you have with your health care provider. Document Revised: 01/08/2018 Document Reviewed: 01/08/2018 Elsevier Patient Education  2020 Elsevier Inc.      Edwina Barth, MD [facility]

## 2019-07-08 NOTE — Patient Instructions (Addendum)
   If you have lab work done today you will be contacted with your lab results within the next 2 weeks.  If you have not heard from us then please contact us. The fastest way to get your results is to register for My Chart.   IF you received an x-ray today, you will receive an invoice from [facility]. Please contact [facility] at [phone] with questions or concerns regarding your invoice.   IF you received labwork today, you will receive an invoice from [facility]. Please contact [facility] at [phone] with questions or concerns regarding your invoice.   Our billing staff will not be able to assist you with questions regarding bills from these companies.  You will be contacted with the lab results as soon as they are available. The fastest way to get your results is to activate your My Chart account. Instructions are located on the last page of this paperwork. If you have not heard from us regarding the results in 2 weeks, please contact this office.     Gout  Gout is painful swelling of your joints. Gout is a type of arthritis. It is caused by having too much uric acid in your body. Uric acid is a chemical that is made when your body breaks down substances called purines. If your body has too much uric acid, sharp crystals can form and build up in your joints. This causes pain and swelling. Gout attacks can happen quickly and be very painful (acute gout). Over time, the attacks can affect more joints and happen more often (chronic gout). What are the causes?  Too much uric acid in your blood. This can happen because: ? Your kidneys do not remove enough uric acid from your blood. ? Your body makes too much uric acid. ? You eat too many foods that are high in purines. These foods include organ meats, some seafood, and beer.  Trauma or stress. What increases the risk?  Having a family history of gout.  Being male and middle-aged.  Being male and having gone  through menopause.  Being very overweight (obese).  Drinking alcohol, especially beer.  Not having enough water in the body (being dehydrated).  Losing weight too quickly.  Having an organ transplant.  Having lead poisoning.  Taking certain medicines.  Having kidney disease.  Having a skin condition called psoriasis. What are the signs or symptoms? An attack of acute gout usually happens in just one joint. The most common place is the big toe. Attacks often start at night. Other joints that may be affected include joints of the feet, ankle, knee, fingers, wrist, or elbow. Symptoms of an attack may include:  Very bad pain.  Warmth.  Swelling.  Stiffness.  Shiny, red, or purple skin.  Tenderness. The affected joint may be very painful to touch.  Chills and fever. Chronic gout may cause symptoms more often. More joints may be involved. You may also have white or yellow lumps (tophi) on your hands or feet or in other areas near your joints. How is this treated?  Treatment for this condition has two phases: treating an acute attack and preventing future attacks.  Acute gout treatment may include: ? NSAIDs. ? Steroids. These are taken by mouth or injected into a joint. ? Colchicine. This medicine relieves pain and swelling. It can be given by mouth or through an IV tube.  Preventive treatment may include: ? Taking small doses of NSAIDs or colchicine daily. ? Using a   medicine that reduces uric acid levels in your blood. ? Making changes to your diet. You may need to see a food expert (dietitian) about what to eat and drink to prevent gout. Follow these instructions at home: During a gout attack   If told, put ice on the painful area: ? Put ice in a plastic bag. ? Place a towel between your skin and the bag. ? Leave the ice on for 20 minutes, 2-3 times a day.  Raise (elevate) the painful joint above the level of your heart as often as you can.  Rest the joint as  much as possible. If the joint is in your leg, you may be given crutches.  Follow instructions from your doctor about what you cannot eat or drink. Avoiding future gout attacks  Eat a low-purine diet. Avoid foods and drinks such as: ? Liver. ? Kidney. ? Anchovies. ? Asparagus. ? Herring. ? Mushrooms. ? Mussels. ? Beer.  Stay at a healthy weight. If you want to lose weight, talk with your doctor. Do not lose weight too fast.  Start or continue an exercise plan as told by your doctor. Eating and drinking  Drink enough fluids to keep your pee (urine) pale yellow.  If you drink alcohol: ? Limit how much you use to:  0-1 drink a day for women.  0-2 drinks a day for men. ? Be aware of how much alcohol is in your drink. In the [place] one drink equals one 12 oz bottle of beer (355 mL), one 5 oz glass of wine (148 mL), or one 1 oz glass of hard liquor (44 mL). General instructions  Take over-the-counter and prescription medicines only as told by your doctor.  Do not drive or use heavy machinery while taking prescription pain medicine.  Return to your normal activities as told by your doctor. Ask your doctor what activities are safe for you.  Keep all follow-up visits as told by your doctor. This is important. Contact a doctor if:  You have another gout attack.  You still have symptoms of a gout attack after 10 days of treatment.  You have problems (side effects) because of your medicines.  You have chills or a fever.  You have burning pain when you pee (urinate).  You have pain in your lower back or belly. Get help right away if:  You have very bad pain.  Your pain cannot be controlled.  You cannot pee. Summary  Gout is painful swelling of the joints.  The most common site of pain is the big toe, but it can affect other joints.  Medicines and avoiding some foods can help to prevent and treat gout attacks. This information is not intended to replace advice given  to you by your health care provider. Make sure you discuss any questions you have with your health care provider. Document Revised: 01/08/2018 Document Reviewed: 01/08/2018 Elsevier Patient Education  2020 Elsevier Inc.  

## 2019-07-17 ENCOUNTER — Ambulatory Visit: Payer: BC Managed Care – PPO | Attending: Internal Medicine

## 2019-07-17 DIAGNOSIS — Z20822 Contact with and (suspected) exposure to covid-19: Secondary | ICD-10-CM

## 2019-07-18 LAB — NOVEL CORONAVIRUS, NAA: SARS-CoV-2, NAA: NOT DETECTED

## 2019-07-20 ENCOUNTER — Telehealth: Payer: Self-pay | Admitting: General Practice

## 2019-07-20 NOTE — Telephone Encounter (Signed)
Negative COVID results given. Patient results "NOT Detected." Caller expressed understanding. ° °

## 2019-07-30 ENCOUNTER — Other Ambulatory Visit: Payer: Self-pay | Admitting: Emergency Medicine

## 2019-07-30 DIAGNOSIS — E559 Vitamin D deficiency, unspecified: Secondary | ICD-10-CM

## 2019-07-30 NOTE — Telephone Encounter (Signed)
Is it ok to refill this med. Patient was last seen 07/08/19 but I do not see where we have checked his vit d level in the past year

## 2019-07-30 NOTE — Telephone Encounter (Signed)
Requested medication (s) are due for refill today: yes  Requested medication (s) are on the active medication list: yes  Last refill:  07/08/2019  Future visit scheduled: no  Notes to clinic:  50,000 IU strengths are not delegated   Requested Prescriptions  Pending Prescriptions Disp Refills   Vitamin D, Ergocalciferol, (DRISDOL) 1.25 MG (50000 UNIT) CAPS capsule [Pharmacy Med Name: VITAMIN D2 1.25MG (50,000 UNIT)] 4 capsule 1    Sig: Take 1 capsule (50,000 Units total) by mouth every 7 (seven) days.      Endocrinology:  Vitamins - Vitamin D Supplementation Failed - 07/30/2019  8:32 AM      Failed - 50,000 IU strengths are not delegated      Failed - Ca in normal range and within 360 days    Calcium  Date Value Ref Range Status  12/12/2017 9.3 8.7 - 10.2 mg/dL Final          Failed - Phosphate in normal range and within 360 days    No results found for: PHOS        Failed - Vitamin D in normal range and within 360 days    No results found for: [id number], [id number], [id number], 25OHVITD3, 25OHVITD2, 25OHVITD3, 25OHVITD2, 25OHVITD1, 25OHVITD2, 25OHVITD3, VD25OH        Passed - Valid encounter within last 12 months    Recent Outpatient Visits           3 weeks ago Acute gouty arthritis   Primary Care at [facility], Eilleen Kempf, [place]   1 year ago Sore throat   Primary Care at [facility], [place], [place]   1 year ago Diarrhea, unspecified type   Primary Care at [facility], [place], [place]   2 years ago Annual physical exam   Primary Care at [facility], Madelaine Bhat, PA-C   3 years ago Gout involving toe of left foot, unspecified cause, unspecified chronicity   Primary Care at [facility], Madelaine Bhat, [place]

## 2019-08-11 ENCOUNTER — Telehealth: Payer: Self-pay | Admitting: General Practice

## 2019-08-11 MED ORDER — COLCHICINE 0.6 MG PO TABS: ORAL_TABLET | ORAL | 1 refills | Status: DC

## 2019-08-11 NOTE — Telephone Encounter (Signed)
Rx cor colchicine 0.6 mg has been sent to pharmacy

## 2019-08-11 NOTE — Addendum Note (Signed)
Addended by: Evie Lacks on: 08/11/2019 12:24 PM   Modules accepted: Orders

## 2019-08-11 NOTE — Telephone Encounter (Signed)
Pt. Wants refill for colchicine 0.6 MG tablet

## 2019-08-11 NOTE — Telephone Encounter (Signed)
Pt. wants refill for colchicine 0.6 MG tablet

## 2019-08-25 ENCOUNTER — Other Ambulatory Visit: Payer: Self-pay | Admitting: Emergency Medicine

## 2019-08-25 DIAGNOSIS — E559 Vitamin D deficiency, unspecified: Secondary | ICD-10-CM

## 2019-08-25 NOTE — Telephone Encounter (Signed)
Patient has a refill good thru 09/24/19. Refusing this request at this time.

## 2019-09-17 ENCOUNTER — Other Ambulatory Visit: Payer: Self-pay | Admitting: Emergency Medicine

## 2019-09-17 DIAGNOSIS — E559 Vitamin D deficiency, unspecified: Secondary | ICD-10-CM

## 2019-09-17 NOTE — Telephone Encounter (Signed)
Requested medication (s) are due for refill today: yes  Requested medication (s) are on the active medication list: yes  Last refill:  08/21/19  Future visit scheduled: no  Notes to clinic:  not delegated    Requested Prescriptions  Pending Prescriptions Disp Refills   Vitamin D, Ergocalciferol, (DRISDOL) 1.25 MG (50000 UNIT) CAPS capsule [Pharmacy Med Name: VITAMIN D2 1.25MG (50,000 UNIT)] 4 capsule 1    Sig: TAKE 1 CAPSULE (50,000 UNITS TOTAL) BY MOUTH EVERY 7 (SEVEN) DAYS.      Endocrinology:  Vitamins - Vitamin D Supplementation Failed - 09/17/2019  8:30 AM      Failed - 50,000 IU strengths are not delegated      Failed - Ca in normal range and within 360 days    Calcium  Date Value Ref Range Status  12/12/2017 9.3 8.7 - 10.2 mg/dL Final          Failed - Phosphate in normal range and within 360 days    No results found for: PHOS        Failed - Vitamin D in normal range and within 360 days    No results found for: [id number], [id number], [id number], 25OHVITD3, 25OHVITD2, 25OHVITD3, 25OHVITD2, 25OHVITD1, 25OHVITD2, 25OHVITD3, VD25OH        Passed - Valid encounter within last 12 months    Recent Outpatient Visits           2 months ago Acute gouty arthritis   Primary Care at [facility], Eilleen Kempf, MD   1 year ago Sore throat   Primary Care at [facility], [place], [place]   1 year ago Diarrhea, unspecified type   Primary Care at [facility], [place], [place]   2 years ago Annual physical exam   Primary Care at [facility], Madelaine Bhat, PA-C   3 years ago Gout involving toe of left foot, unspecified cause, unspecified chronicity   Primary Care at [facility], Madelaine Bhat, [place]

## 2019-09-30 ENCOUNTER — Other Ambulatory Visit: Payer: Self-pay | Admitting: Emergency Medicine

## 2019-09-30 DIAGNOSIS — K219 Gastro-esophageal reflux disease without esophagitis: Secondary | ICD-10-CM

## 2019-09-30 NOTE — Telephone Encounter (Signed)
Requested Prescriptions  Pending Prescriptions Disp Refills  . pantoprazole (PROTONIX) 40 MG tablet [Pharmacy Med Name: PANTOPRAZOLE SOD DR 40 MG TAB] 90 tablet 3    Sig: TAKE 1 TABLET BY MOUTH EVERY DAY     Gastroenterology: Proton Pump Inhibitors Passed - 09/30/2019  1:19 AM      Passed - Valid encounter within last 12 months    Recent Outpatient Visits          2 months ago Acute gouty arthritis   Primary Care at [facility], Eilleen Kempf, MD   1 year ago Sore throat   Primary Care at [facility], [place], [place]   1 year ago Diarrhea, unspecified type   Primary Care at [facility], [place], [place]   2 years ago Annual physical exam   Primary Care at [facility], Madelaine Bhat, PA-C   3 years ago Gout involving toe of left foot, unspecified cause, unspecified chronicity   Primary Care at [facility], Madelaine Bhat, [place]

## 2019-10-02 IMAGING — DX DG LUMBAR SPINE COMPLETE 4+V
5 series · 5 of 5 positions shown · non-contrast
Comparison: None.

CLINICAL DATA: Chronic lumbago

EXAM:
LUMBAR SPINE - COMPLETE 4+ VIEW

[l-spine ap]
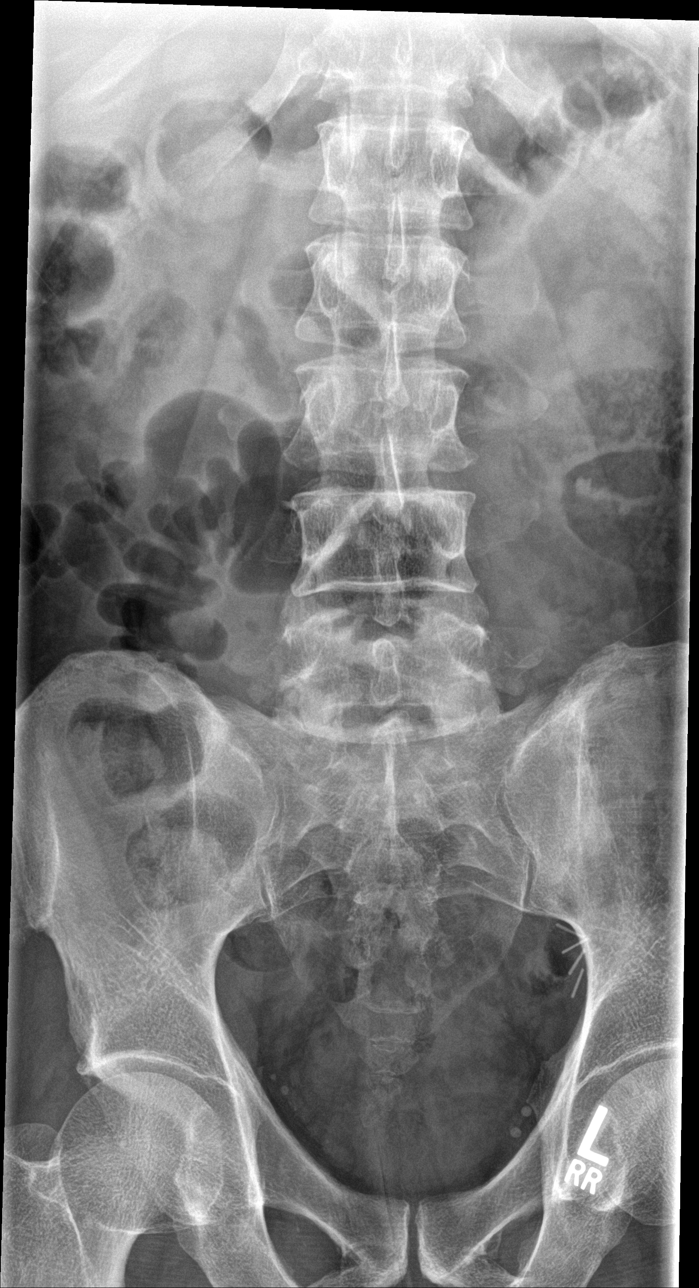

[l-spine obl (1 of 2)]
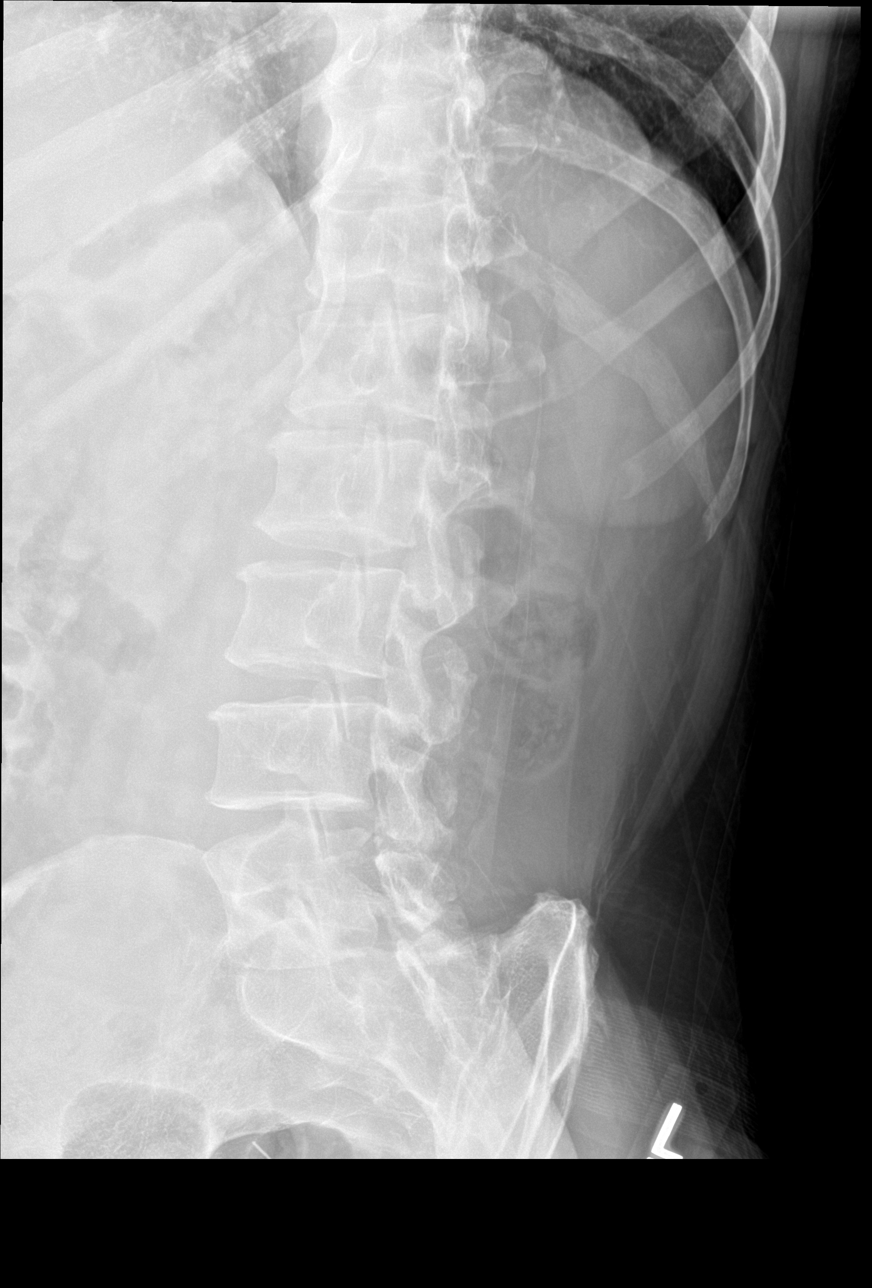

[l-spine obl (2 of 2)]
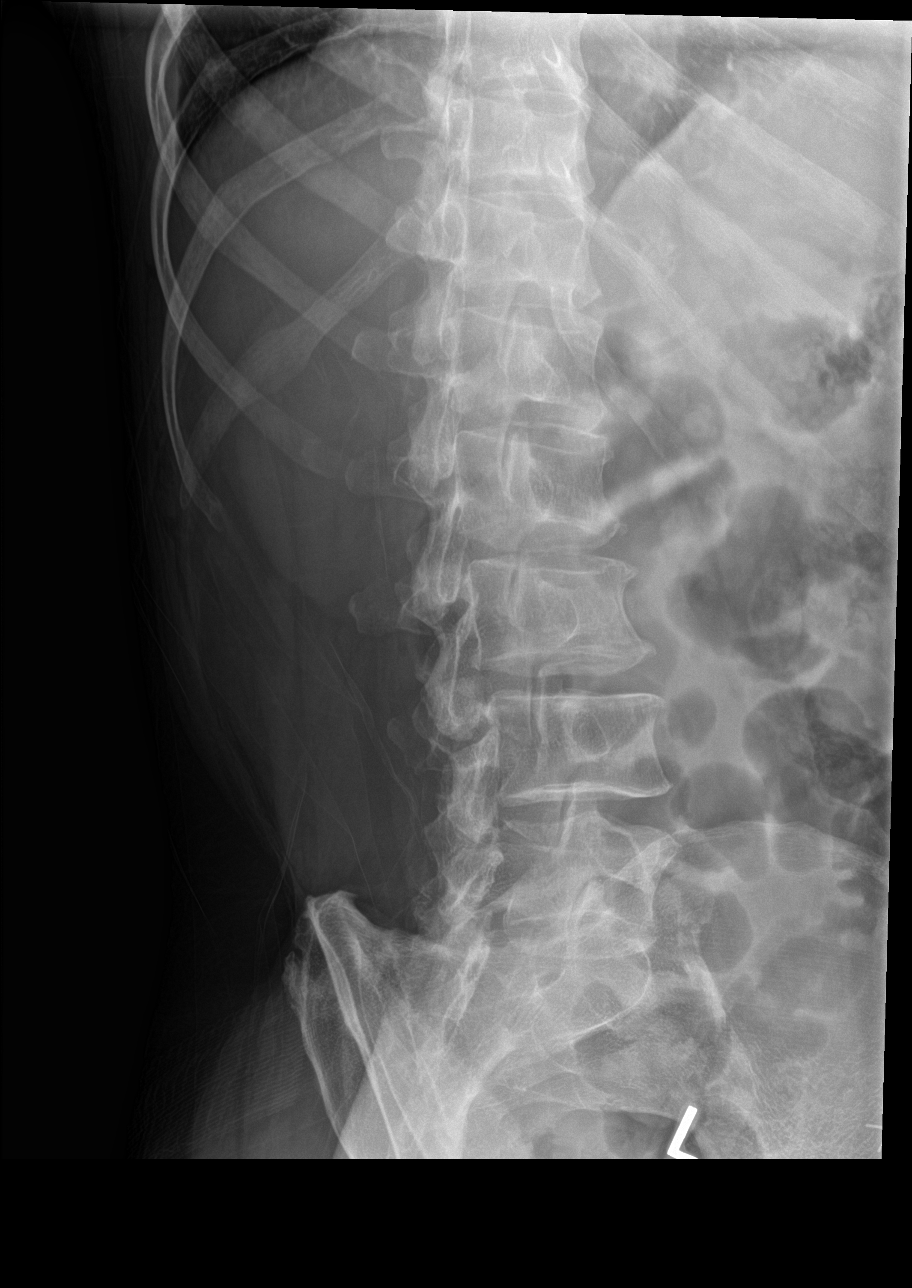

[l-spine lat]
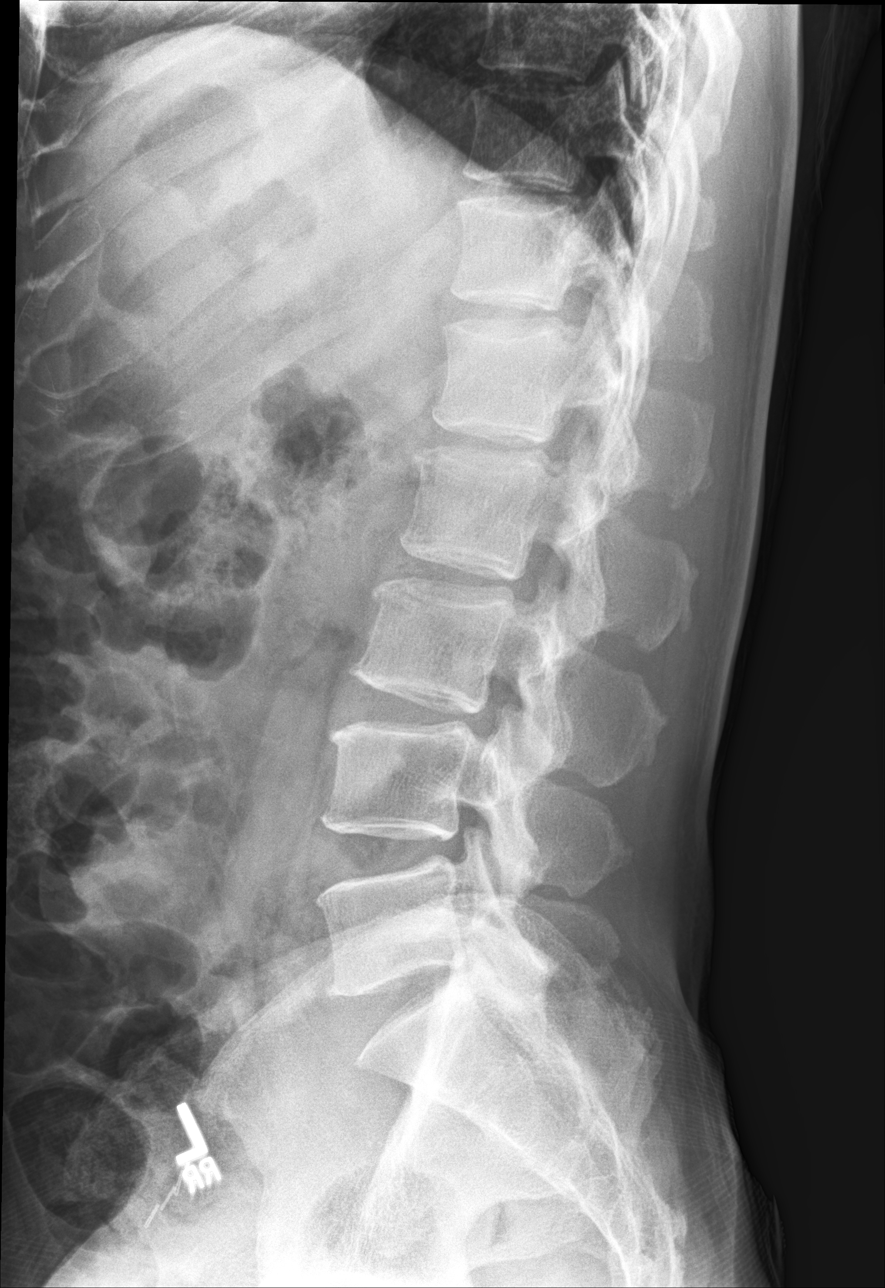

[l-spine l5-s1]
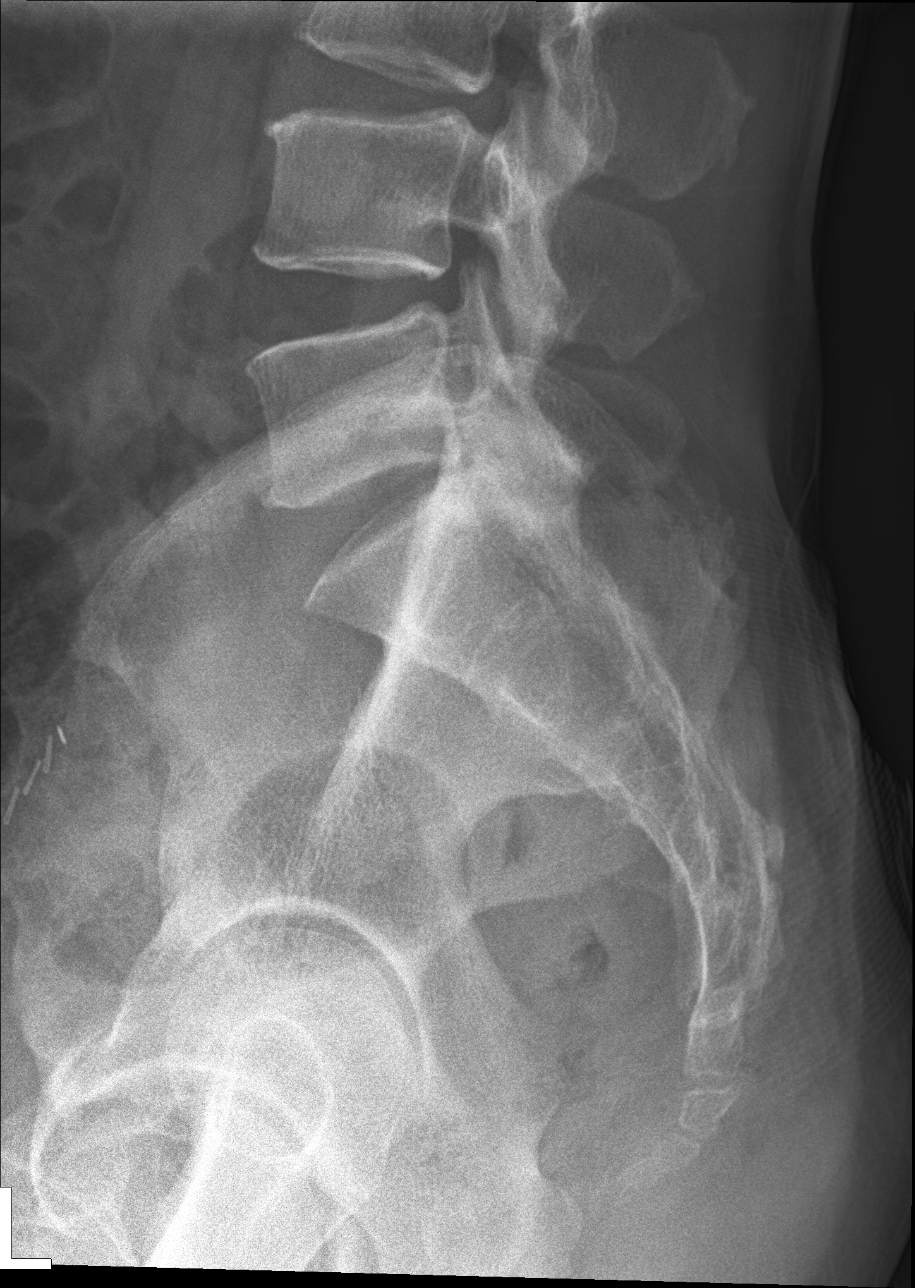

[5 of 5 positions shown; findings below may reference images not displayed]

FINDINGS: Frontal, lateral, spot lumbosacral lateral, and bilateral oblique
views were obtained. There are 5 non-rib-bearing lumbar type
vertebral bodies. There is slight lumbar dextroscoliosis. There is
no fracture or spondylolisthesis. Disc spaces appear unremarkable.
There is no appreciable facet arthropathy. There are surgical clips
in the left pelvis.

There appears to be a degree of fusion along portions of the
sacroiliac joints.
IMPRESSION: Slight scoliosis. No fracture or spondylolisthesis. No appreciable
disc space narrowing or facet arthropathy.

There appears to be a degree of fusion along portions of the
superior sacroiliac joints. Question a degree of chronic
sacroiliitis. This finding may warrant dedicated sacroiliac joint
images to further evaluate.

## 2019-10-23 ENCOUNTER — Other Ambulatory Visit: Payer: Self-pay | Admitting: Emergency Medicine

## 2019-10-23 DIAGNOSIS — E559 Vitamin D deficiency, unspecified: Secondary | ICD-10-CM

## 2019-10-23 NOTE — Telephone Encounter (Signed)
Requested medication (s) are due for refill today: yes  Requested medication (s) are on the active medication list: yes  Last refill:  09/18/19  Future visit scheduled:no  Notes to clinic:  not delegated    Requested Prescriptions  Pending Prescriptions Disp Refills   Vitamin D, Ergocalciferol, (DRISDOL) 1.25 MG (50000 UNIT) CAPS capsule [Pharmacy Med Name: VITAMIN D2 1.25MG (50,000 UNIT)] 4 capsule 1    Sig: TAKE 1 CAPSULE (50,000 UNITS TOTAL) BY MOUTH EVERY 7 (SEVEN) DAYS.      Endocrinology:  Vitamins - Vitamin D Supplementation Failed - 10/23/2019 11:33 AM      Failed - 50,000 IU strengths are not delegated      Failed - Ca in normal range and within 360 days    Calcium  Date Value Ref Range Status  12/12/2017 9.3 8.7 - 10.2 mg/dL Final          Failed - Phosphate in normal range and within 360 days    No results found for: PHOS        Failed - Vitamin D in normal range and within 360 days    No results found for: [id number], [id number], [id number], 25OHVITD3, 25OHVITD2, 25OHVITD3, 25OHVITD2, 25OHVITD1, 25OHVITD2, 25OHVITD3, VD25OH        Passed - Valid encounter within last 12 months    Recent Outpatient Visits           3 months ago Acute gouty arthritis   Primary Care at [facility], Eilleen Kempf, MD   1 year ago Sore throat   Primary Care at [facility], [place], [place]   1 year ago Diarrhea, unspecified type   Primary Care at [facility], [place], [place]   2 years ago Annual physical exam   Primary Care at [facility], Madelaine Bhat, PA-C   3 years ago Gout involving toe of left foot, unspecified cause, unspecified chronicity   Primary Care at [facility], Madelaine Bhat, [place]

## 2019-11-21 ENCOUNTER — Other Ambulatory Visit: Payer: Self-pay | Admitting: Emergency Medicine

## 2019-11-21 DIAGNOSIS — E559 Vitamin D deficiency, unspecified: Secondary | ICD-10-CM

## 2019-11-21 NOTE — Telephone Encounter (Signed)
Requested medication (s) are due for refill today: yes  Requested medication (s) are on the active medication list: yes  Last refill:  10/23/19  Future visit scheduled: no  Notes to clinic:  med not delegated to NT to RF; overdue labs   Requested Prescriptions  Pending Prescriptions Disp Refills   Vitamin D, Ergocalciferol, (DRISDOL) 1.25 MG (50000 UNIT) CAPS capsule [Pharmacy Med Name: VITAMIN D2 1.25MG (50,000 UNIT)] 4 capsule 1    Sig: TAKE 1 CAPSULE (50,000 UNITS TOTAL) BY MOUTH EVERY 7 (SEVEN) DAYS.      Endocrinology:  Vitamins - Vitamin D Supplementation Failed - 11/21/2019 10:57 AM      Failed - 50,000 IU strengths are not delegated      Failed - Ca in normal range and within 360 days    Calcium  Date Value Ref Range Status  12/12/2017 9.3 8.7 - 10.2 mg/dL Final          Failed - Phosphate in normal range and within 360 days    No results found for: PHOS        Failed - Vitamin D in normal range and within 360 days    No results found for: [id number], [id number], [id number], 25OHVITD3, 25OHVITD2, 25OHVITD3, 25OHVITD2, 25OHVITD1, 25OHVITD2, 25OHVITD3, VD25OH        Passed - Valid encounter within last 12 months    Recent Outpatient Visits           4 months ago Acute gouty arthritis   Primary Care at [facility], Eilleen Kempf, MD   1 year ago Sore throat   Primary Care at [facility], [place], [place]   1 year ago Diarrhea, unspecified type   Primary Care at [facility], [place], [place]   2 years ago Annual physical exam   Primary Care at [facility], Madelaine Bhat, PA-C   3 years ago Gout involving toe of left foot, unspecified cause, unspecified chronicity   Primary Care at [facility], Madelaine Bhat, [place]

## 2019-12-19 ENCOUNTER — Other Ambulatory Visit: Payer: Self-pay | Admitting: Emergency Medicine

## 2019-12-19 DIAGNOSIS — E559 Vitamin D deficiency, unspecified: Secondary | ICD-10-CM

## 2019-12-19 NOTE — Telephone Encounter (Signed)
Requested medication (s) are due for refill today: no  Requested medication (s) are on the active medication list: yes  Last refill:  11/23/19  Future visit scheduled: no  Notes to clinic:  NT not delegated to refuse, refill this med- Over due lab work   Requested Prescriptions  Pending Prescriptions Disp Refills   Vitamin D, Ergocalciferol, (DRISDOL) 1.25 MG (50000 UNIT) CAPS capsule [Pharmacy Med Name: VITAMIN D2 1.25MG (50,000 UNIT)] 4 capsule 1    Sig: TAKE 1 CAPSULE (50,000 UNITS TOTAL) BY MOUTH EVERY 7 (SEVEN) DAYS.      Endocrinology:  Vitamins - Vitamin D Supplementation Failed - 12/19/2019 10:31 AM      Failed - 50,000 IU strengths are not delegated      Failed - Ca in normal range and within 360 days    Calcium  Date Value Ref Range Status  12/12/2017 9.3 8.7 - 10.2 mg/dL Final          Failed - Phosphate in normal range and within 360 days    No results found for: PHOS        Failed - Vitamin D in normal range and within 360 days    No results found for: [id number], [id number], [id number], 25OHVITD3, 25OHVITD2, 25OHVITD3, 25OHVITD2, 25OHVITD1, 25OHVITD2, 25OHVITD3, VD25OH        Passed - Valid encounter within last 12 months    Recent Outpatient Visits           5 months ago Acute gouty arthritis   Primary Care at [facility], Eilleen Kempf, MD   1 year ago Sore throat   Primary Care at [facility], [place], [place]   2 years ago Diarrhea, unspecified type   Primary Care at [facility], [place], [place]   2 years ago Annual physical exam   Primary Care at [facility], Madelaine Bhat, PA-C   3 years ago Gout involving toe of left foot, unspecified cause, unspecified chronicity   Primary Care at [facility], Madelaine Bhat, [place]

## 2019-12-21 NOTE — Telephone Encounter (Signed)
Patient is requesting a refill of the following medications: Requested Prescriptions   Pending Prescriptions Disp Refills  . Vitamin D, Ergocalciferol, (DRISDOL) 1.25 MG (50000 UNIT) CAPS capsule [Pharmacy Med Name: VITAMIN D2 1.25MG (50,000 UNIT)] 4 capsule 1    Sig: TAKE 1 CAPSULE (50,000 UNITS TOTAL) BY MOUTH EVERY 7 (SEVEN) DAYS.    Date of patient request: 12/19/2019 Last office visit: 07/08/2019 Date of last refill: 11/23/2019 Last refill amount: 4 capsules Follow up time period per chart:N/a

## 2020-01-23 ENCOUNTER — Other Ambulatory Visit: Payer: Self-pay | Admitting: Emergency Medicine

## 2020-01-23 DIAGNOSIS — E559 Vitamin D deficiency, unspecified: Secondary | ICD-10-CM

## 2020-01-23 NOTE — Telephone Encounter (Signed)
Requested medication (s) are due for refill today: yes  Requested medication (s) are on the active medication list: yes  Last refill:  12/21/19  Future visit scheduled: no  Notes to clinic:  med not delegated to NT to RF   Requested Prescriptions  Pending Prescriptions Disp Refills   Vitamin D, Ergocalciferol, (DRISDOL) 1.25 MG (50000 UNIT) CAPS capsule [Pharmacy Med Name: VITAMIN D2 1.25MG (50,000 UNIT)] 4 capsule 1    Sig: TAKE 1 CAPSULE (50,000 UNITS TOTAL) BY MOUTH EVERY 7 (SEVEN) DAYS.      Endocrinology:  Vitamins - Vitamin D Supplementation Failed - 01/23/2020  2:33 PM      Failed - 50,000 IU strengths are not delegated      Failed - Ca in normal range and within 360 days    Calcium  Date Value Ref Range Status  12/12/2017 9.3 8.7 - 10.2 mg/dL Final          Failed - Phosphate in normal range and within 360 days    No results found for: PHOS        Failed - Vitamin D in normal range and within 360 days    No results found for: [id number], [id number], [id number], 25OHVITD3, 25OHVITD2, 25OHVITD3, 25OHVITD2, 25OHVITD1, 25OHVITD2, 25OHVITD3, VD25OH        Passed - Valid encounter within last 12 months    Recent Outpatient Visits           6 months ago Acute gouty arthritis   Primary Care at [facility], Eilleen Kempf, MD   2 years ago Sore throat   Primary Care at [facility], [place], [place]   2 years ago Diarrhea, unspecified type   Primary Care at [facility], [place], [place]   2 years ago Annual physical exam   Primary Care at [facility], Madelaine Bhat, PA-C   3 years ago Gout involving toe of left foot, unspecified cause, unspecified chronicity   Primary Care at [facility], Madelaine Bhat, [place]

## 2020-01-25 NOTE — Telephone Encounter (Signed)
Please schedule patient a f/u visit need labs done before we can refill vit d. No level on fill.

## 2020-01-25 NOTE — Telephone Encounter (Signed)
01/25/2020 - PATIENT REQUESTING A REFILL ON HIS VITAMIN D2 MEDICATION. I HAVE CALLED HIM TO SCHEDULE A FOLLOW-UP APPOINTMENT WITH DR. Irving Shows TO HAVE HIS VITAMIN D LEVELS CHECKED. THE APPOINTMENT IS ON TUES. 02/09/2020 AT 2:20 pm. I WILL NOT ROUTE THIS MESSAGE BACK TO THE CLINICAL TEAM BECAUSE PATIENT HAS ENOUGH MEDICATION TO LAST UNTIL HIS APPOINTMENT WITH DR. Irving Shows. MBC

## 2020-02-09 ENCOUNTER — Encounter: Payer: Self-pay | Admitting: Emergency Medicine

## 2020-02-09 ENCOUNTER — Other Ambulatory Visit: Payer: Self-pay

## 2020-02-09 ENCOUNTER — Ambulatory Visit (INDEPENDENT_AMBULATORY_CARE_PROVIDER_SITE_OTHER): Payer: BC Managed Care – PPO | Admitting: Emergency Medicine

## 2020-02-09 VITALS — BP 153/82 | HR 68 | Temp 97.6°F | Ht 66.0 in | Wt 152.6 lb

## 2020-02-09 DIAGNOSIS — R140 Abdominal distension (gaseous): Secondary | ICD-10-CM | POA: Diagnosis not present

## 2020-02-09 DIAGNOSIS — E559 Vitamin D deficiency, unspecified: Secondary | ICD-10-CM

## 2020-02-09 DIAGNOSIS — Z8739 Personal history of other diseases of the musculoskeletal system and connective tissue: Secondary | ICD-10-CM | POA: Diagnosis not present

## 2020-02-09 MED ORDER — ALLOPURINOL 100 MG PO TABS: 100.0000 mg | ORAL_TABLET | Freq: Every day | ORAL | 1 refills | Status: DC

## 2020-02-09 NOTE — Progress Notes (Signed)
Joel Hughes 59 y.o.   Chief Complaint  Patient presents with   Follow-up    Vit D and he has been having some problems with stomasch bloating for the past 2 weeks and Lt hip pain    HISTORY OF PRESENT ILLNESS: This is a 59 y.o. male with history of vitamin D deficiency here for follow-up. Also complaining of occasional abdominal bloating intermittent for the past 2 weeks.  Still able to eat and drink okay.  Denies nausea or vomiting.  Has occasional constipation.  Does not have a daily bowel movement.  Denies fever chills or any other associated symptoms. Has history of gout.  Requesting maintenance therapy for uric acid.  Colchicine works for acute attacks. No other complaints or medical concerns today.  HPI   Prior to Admission medications   Medication Sig Start Date End Date Taking? Authorizing Provider  colchicine 0.6 MG tablet Take 1.2 mg now and 0.6 mg one hour later. Take 0.6 mg daily after that x 5 days. 08/11/19  Yes Georgina Quint, MD  pantoprazole (PROTONIX) 40 MG tablet TAKE 1 TABLET BY MOUTH EVERY DAY 09/30/19  Yes Curtez Brallier, Eilleen Kempf, MD  Vitamin D, Ergocalciferol, (DRISDOL) 1.25 MG (50000 UNIT) CAPS capsule TAKE 1 CAPSULE (50,000 UNITS TOTAL) BY MOUTH EVERY 7 (SEVEN) DAYS. 12/21/19  Yes Georgina Quint, MD  allopurinol (ZYLOPRIM) 100 MG tablet Take 1 tablet (100 mg total) by mouth daily. 02/09/20 05/09/20  Georgina Quint, MD    No Known Allergies  Patient Active Problem List   Diagnosis Date Noted   Great toe pain, left 04/11/2016   Hypertriglyceridemia 11/02/2013   Voice hoarseness 10/18/2011    Past Medical History:  Diagnosis Date   Allergy    Seizures (HCC)     History reviewed. No pertinent surgical history.  Social History   Socioeconomic History   Marital status: Married    Spouse name: Not on file   Number of children: Not on file   Years of education: Not on file   Highest education level: Not on file    Occupational History   Not on file  Tobacco Use   Smoking status: Never Smoker   Smokeless tobacco: Never Used  Substance and Sexual Activity   Alcohol use: No   Drug use: No   Sexual activity: Never  Other Topics Concern   Not on file  Social History Narrative   Not on file   Social Determinants of Health   Financial Resource Strain:    Difficulty of Paying Living Expenses:   Food Insecurity:    Worried About Programme researcher, broadcasting/film/video in the Last Year:    Barista in the Last Year:   Transportation Needs:    Freight forwarder (Medical):    Lack of Transportation (Non-Medical):   Physical Activity:    Days of Exercise per Week:    Minutes of Exercise per Session:   Stress:    Feeling of Stress :   Social Connections:    Frequency of Communication with Friends and Family:    Frequency of Social Gatherings with Friends and Family:    Attends Religious Services:    Active Member of Clubs or Organizations:    Attends Banker Meetings:    Marital Status:   Intimate Partner Violence:    Fear of Current or Ex-Partner:    Emotionally Abused:    Physically Abused:    Sexually Abused:     History  reviewed. No pertinent family history.   Review of Systems  Constitutional: Negative.  Negative for chills and fever.  HENT: Negative.  Negative for congestion and sore throat.   Respiratory: Negative.  Negative for cough and shortness of breath.   Cardiovascular: Negative.  Negative for chest pain and palpitations.  Gastrointestinal: Negative.  Negative for abdominal pain, blood in stool, diarrhea, nausea and vomiting.  Genitourinary: Negative.  Negative for dysuria and hematuria.  Musculoskeletal: Negative.  Negative for myalgias and neck pain.  Skin: Negative.  Negative for rash.  Neurological: Negative.  Negative for dizziness and headaches.  All other systems reviewed and are negative.  Today's Vitals   02/09/20 1413  02/09/20 1418  BP: (!) 168/97 (!) 153/82  Pulse: 68   Temp: 97.6 F (36.4 C)   TempSrc: Temporal   SpO2: 97%   Weight: 152 lb 9.6 oz (69.2 kg)   Height: '5\' 6"'$  (1.676 m)    Body mass index is 24.63 kg/m.   Physical Exam Vitals reviewed.  Constitutional:      Appearance: Normal appearance.  HENT:     Head: Normocephalic.  Eyes:     Extraocular Movements: Extraocular movements intact.     Conjunctiva/sclera: Conjunctivae normal.     Pupils: Pupils are equal, round, and reactive to light.  Cardiovascular:     Rate and Rhythm: Normal rate and regular rhythm.     Pulses: Normal pulses.     Heart sounds: Normal heart sounds.  Pulmonary:     Effort: Pulmonary effort is normal.     Breath sounds: Normal breath sounds.  Abdominal:     General: Bowel sounds are normal. There is no distension.     Palpations: Abdomen is soft. There is no mass.     Tenderness: There is no abdominal tenderness.  Musculoskeletal:        General: Normal range of motion.     Cervical back: Normal range of motion and neck supple.  Skin:    General: Skin is warm and dry.     Capillary Refill: Capillary refill takes less than 2 seconds.  Neurological:     General: No focal deficit present.     Mental Status: He is alert and oriented to person, place, and time.  Psychiatric:        Mood and Affect: Mood normal.        Behavior: Behavior normal.    A total of 30 minutes was spent with the patient, greater than 50% of which was in counseling/coordination of care regarding differential diagnosis of abdominal bloating, diet and nutrition in particular fiber intake and need for probiotics, review of most recent office visit notes, review of most recent blood work results, vitamin D deficiency and its symptoms, need to continue replacement therapy, gout and uric acid, allopurinol for maintenance therapy, prognosis and need for follow-up.   ASSESSMENT & PLAN: [name] was seen today for  follow-up.  Diagnoses and all orders for this visit:  Vitamin D deficiency -     VITAMIN D 25 Hydroxy (Vit-D Deficiency, Fractures)  Abdominal bloating -     CMP14+EGFR -     CBC with Differential/Platelet  History of gout -     Uric Acid -     allopurinol (ZYLOPRIM) 100 MG tablet; Take 1 tablet (100 mg total) by mouth daily.    Patient Instructions    Increase intake of fiber and decrease intake of carbohydrates. Fiber supplements daily as well as probiotics.  If you have lab work done today you will be contacted with your lab results within the next 2 weeks.  If you have not heard from [place] then please contact us. The fastest way to get your results is to register for My Chart.   IF you received an x-ray today, you will receive an invoice from [facility]. Please contact [facility] at [phone] with questions or concerns regarding your invoice.   IF you received labwork today, you will receive an invoice from [facility]. Please contact [facility] at [phone] with questions or concerns regarding your invoice.   Our billing staff will not be able to assist you with questions regarding bills from these companies.  You will be contacted with the lab results as soon as they are available. The fastest way to get your results is to activate your My Chart account. Instructions are located on the last page of this paperwork. If you have not heard from [place] regarding the results in 2 weeks, please contact this office.     Abdominal Bloating When you have abdominal bloating, your abdomen may feel full, tight, or painful. It may also look bigger than normal or swollen (distended). Common causes of abdominal bloating include:  Swallowing air.  Constipation.  Problems digesting food.  Eating too much.  Irritable bowel syndrome. This is a condition that affects the large intestine.  Lactose intolerance. This is an inability to digest lactose, a natural sugar in  dairy products.  Celiac disease. This is a condition that affects the ability to digest gluten, a protein found in some grains.  Gastroparesis. This is a condition that slows down the movement of food in the stomach and small intestine. It is more common in people with diabetes mellitus.  Gastroesophageal reflux disease (GERD). This is a digestive condition that makes stomach acid flow back into the esophagus.  Urinary retention. This means that the body is holding onto urine, and the bladder cannot be emptied all the way. Follow these instructions at home: Eating and drinking  Avoid eating too much.  Try not to swallow air while talking or eating.  Avoid eating while lying down.  Avoid these foods and drinks: ? Foods that cause gas, such as broccoli, cabbage, cauliflower, and baked beans. ? Carbonated drinks. ? Hard candy. ? Chewing gum. Medicines  Take over-the-counter and prescription medicines only as told by your health care provider.  Take probiotic medicines. These medicines contain live bacteria or yeasts that can help digestion.  Take coated peppermint oil capsules. Activity  Try to exercise regularly. Exercise may help to relieve bloating that is caused by gas and relieve constipation. General instructions  Keep all follow-up visits as told by your health care provider. This is important. Contact a health care provider if:  You have nausea and vomiting.  You have diarrhea.  You have abdominal pain.  You have unusual weight loss or weight gain.  You have severe pain, and medicines do not help. Get help right away if:  You have severe chest pain.  You have trouble breathing.  You have shortness of breath.  You have trouble urinating.  You have darker urine than normal.  You have blood in your stools or have dark, tarry stools. Summary  Abdominal bloating means that the abdomen is swollen.  Common causes of abdominal bloating are swallowing air,  constipation, and problems digesting food.  Avoid eating too much and avoid swallowing air.  Avoid foods that cause gas, carbonated drinks, hard  candy, and chewing gum. This information is not intended to replace advice given to you by your health care provider. Make sure you discuss any questions you have with your health care provider. Document Revised: 10/06/2018 Document Reviewed: 07/20/2016 Elsevier Patient Education  2020 Elsevier Inc.      Agustina Caroli, MD [facility]

## 2020-02-09 NOTE — Patient Instructions (Addendum)
Increase intake of fiber and decrease intake of carbohydrates. Fiber supplements daily as well as probiotics.   If you have lab work done today you will be contacted with your lab results within the next 2 weeks.  If you have not heard from [place] then please contact us. The fastest way to get your results is to register for My Chart.   IF you received an x-ray today, you will receive an invoice from [facility]. Please contact [facility] at [phone] with questions or concerns regarding your invoice.   IF you received labwork today, you will receive an invoice from [facility]. Please contact [facility] at [phone] with questions or concerns regarding your invoice.   Our billing staff will not be able to assist you with questions regarding bills from these companies.  You will be contacted with the lab results as soon as they are available. The fastest way to get your results is to activate your My Chart account. Instructions are located on the last page of this paperwork. If you have not heard from [place] regarding the results in 2 weeks, please contact this office.     Abdominal Bloating When you have abdominal bloating, your abdomen may feel full, tight, or painful. It may also look bigger than normal or swollen (distended). Common causes of abdominal bloating include:  Swallowing air.  Constipation.  Problems digesting food.  Eating too much.  Irritable bowel syndrome. This is a condition that affects the large intestine.  Lactose intolerance. This is an inability to digest lactose, a natural sugar in dairy products.  Celiac disease. This is a condition that affects the ability to digest gluten, a protein found in some grains.  Gastroparesis. This is a condition that slows down the movement of food in the stomach and small intestine. It is more common in people with diabetes mellitus.  Gastroesophageal reflux disease (GERD). This is a digestive condition that  makes stomach acid flow back into the esophagus.  Urinary retention. This means that the body is holding onto urine, and the bladder cannot be emptied all the way. Follow these instructions at home: Eating and drinking  Avoid eating too much.  Try not to swallow air while talking or eating.  Avoid eating while lying down.  Avoid these foods and drinks: ? Foods that cause gas, such as broccoli, cabbage, cauliflower, and baked beans. ? Carbonated drinks. ? Hard candy. ? Chewing gum. Medicines  Take over-the-counter and prescription medicines only as told by your health care provider.  Take probiotic medicines. These medicines contain live bacteria or yeasts that can help digestion.  Take coated peppermint oil capsules. Activity  Try to exercise regularly. Exercise may help to relieve bloating that is caused by gas and relieve constipation. General instructions  Keep all follow-up visits as told by your health care provider. This is important. Contact a health care provider if:  You have nausea and vomiting.  You have diarrhea.  You have abdominal pain.  You have unusual weight loss or weight gain.  You have severe pain, and medicines do not help. Get help right away if:  You have severe chest pain.  You have trouble breathing.  You have shortness of breath.  You have trouble urinating.  You have darker urine than normal.  You have blood in your stools or have dark, tarry stools. Summary  Abdominal bloating means that the abdomen is swollen.  Common causes of abdominal bloating are swallowing air, constipation, and problems digesting food.  Avoid eating too much and avoid swallowing air.  Avoid foods that cause gas, carbonated drinks, hard candy, and chewing gum. This information is not intended to replace advice given to you by your health care provider. Make sure you discuss any questions you have with your health care provider. Document Revised:  10/06/2018 Document Reviewed: 07/20/2016 Elsevier Patient Education  2020 [employer].

## 2020-02-10 LAB — CMP14+EGFR
ALT: 26 IU/L (ref 0–44)
AST: 20 IU/L (ref 0–40)
Albumin/Globulin Ratio: 1.3 (ref 1.2–2.2)
Albumin: 4.1 g/dL (ref 3.8–4.9)
Alkaline Phosphatase: 55 IU/L (ref 48–121)
BUN/Creatinine Ratio: 12 (ref 9–20)
BUN: 17 mg/dL (ref 6–24)
Bilirubin Total: 0.4 mg/dL (ref 0.0–1.2)
CO2: 26 mmol/L (ref 20–29)
Calcium: 9.1 mg/dL (ref 8.7–10.2)
Chloride: 101 mmol/L (ref 96–106)
Creatinine, Ser: 1.45 mg/dL — ABNORMAL HIGH (ref 0.76–1.27)
GFR calc Af Amer: 61 mL/min/{1.73_m2} (ref >59)
GFR calc non Af Amer: 53 mL/min/{1.73_m2} — ABNORMAL LOW (ref >59)
Globulin, Total: 3.2 g/dL (ref 1.5–4.5)
Glucose: 91 mg/dL (ref 65–99)
Potassium: 3.9 mmol/L (ref 3.5–5.2)
Sodium: 140 mmol/L (ref 134–144)
Total Protein: 7.3 g/dL (ref 6.0–8.5)

## 2020-02-10 LAB — CBC WITH DIFFERENTIAL/PLATELET
Basophils Absolute: 0.1 10*3/uL (ref 0.0–0.2)
Basos: 1 %
EOS (ABSOLUTE): 0 10*3/uL (ref 0.0–0.4)
Eos: 1 %
Hematocrit: 39.3 % (ref 37.5–51.0)
Hemoglobin: 13.1 g/dL (ref 13.0–17.7)
Immature Grans (Abs): 0 10*3/uL (ref 0.0–0.1)
Immature Granulocytes: 0 %
Lymphocytes Absolute: 2.2 10*3/uL (ref 0.7–3.1)
Lymphs: 50 %
MCH: 32.1 pg (ref 26.6–33.0)
MCHC: 33.3 g/dL (ref 31.5–35.7)
MCV: 96 fL (ref 79–97)
Monocytes Absolute: 0.4 10*3/uL (ref 0.1–0.9)
Monocytes: 8 %
Neutrophils Absolute: 1.8 10*3/uL (ref 1.4–7.0)
Neutrophils: 40 %
Platelets: 219 10*3/uL (ref 150–450)
RBC: 4.08 x10E6/uL — ABNORMAL LOW (ref 4.14–5.80)
RDW: 14 % (ref 11.6–15.4)
WBC: 4.4 10*3/uL (ref 3.4–10.8)

## 2020-02-10 LAB — VITAMIN D 25 HYDROXY (VIT D DEFICIENCY, FRACTURES): Vit D, 25-Hydroxy: 36.1 ng/mL (ref 30.0–100.0)

## 2020-02-10 LAB — URIC ACID: Uric Acid: 7.6 mg/dL (ref 3.8–8.4)

## 2020-04-27 ENCOUNTER — Encounter: Payer: Self-pay | Admitting: Emergency Medicine

## 2020-04-27 ENCOUNTER — Ambulatory Visit (INDEPENDENT_AMBULATORY_CARE_PROVIDER_SITE_OTHER): Payer: BC Managed Care – PPO | Admitting: Emergency Medicine

## 2020-04-27 ENCOUNTER — Other Ambulatory Visit: Payer: Self-pay

## 2020-04-27 ENCOUNTER — Ambulatory Visit: Payer: BC Managed Care – PPO | Admitting: Family Medicine

## 2020-04-27 VITALS — BP 168/82 | HR 67 | Temp 98.0°F | Resp 16 | Ht 66.0 in | Wt 149.8 lb

## 2020-04-27 DIAGNOSIS — M109 Gout, unspecified: Secondary | ICD-10-CM

## 2020-04-27 DIAGNOSIS — E559 Vitamin D deficiency, unspecified: Secondary | ICD-10-CM | POA: Diagnosis not present

## 2020-04-27 DIAGNOSIS — E790 Hyperuricemia without signs of inflammatory arthritis and tophaceous disease: Secondary | ICD-10-CM | POA: Diagnosis not present

## 2020-04-27 MED ORDER — INDOMETHACIN 50 MG PO CAPS: 50.0000 mg | ORAL_CAPSULE | Freq: Three times a day (TID) | ORAL | 0 refills | Status: AC

## 2020-04-27 MED ORDER — COLCHICINE 0.6 MG PO TABS: ORAL_TABLET | ORAL | 1 refills | Status: DC

## 2020-04-27 MED ORDER — VITAMIN D (ERGOCALCIFEROL) 1.25 MG (50000 UNIT) PO CAPS: 50000.0000 [IU] | ORAL_CAPSULE | ORAL | 1 refills | Status: DC

## 2020-04-27 NOTE — Patient Instructions (Addendum)
Increase daily allopurinol to 200 mg.   If you have lab work done today you will be contacted with your lab results within the next 2 weeks.  If you have not heard from [place] then please contact us. The fastest way to get your results is to register for My Chart.   IF you received an x-ray today, you will receive an invoice from [facility]. Please contact [facility] at [phone] with questions or concerns regarding your invoice.   IF you received labwork today, you will receive an invoice from [facility]. Please contact [facility] at [phone] with questions or concerns regarding your invoice.   Our billing staff will not be able to assist you with questions regarding bills from these companies.  You will be contacted with the lab results as soon as they are available. The fastest way to get your results is to activate your My Chart account. Instructions are located on the last page of this paperwork. If you have not heard from [place] regarding the results in 2 weeks, please contact this office.     Gout  Gout is painful swelling of your joints. Gout is a type of arthritis. It is caused by having too much uric acid in your body. Uric acid is a chemical that is made when your body breaks down substances called purines. If your body has too much uric acid, sharp crystals can form and build up in your joints. This causes pain and swelling. Gout attacks can happen quickly and be very painful (acute gout). Over time, the attacks can affect more joints and happen more often (chronic gout). What are the causes?  Too much uric acid in your blood. This can happen because: ? Your kidneys do not remove enough uric acid from your blood. ? Your body makes too much uric acid. ? You eat too many foods that are high in purines. These foods include organ meats, some seafood, and beer.  Trauma or stress. What increases the risk?  Having a family history of gout.  Being male and  middle-aged.  Being male and having gone through menopause.  Being very overweight (obese).  Drinking alcohol, especially beer.  Not having enough water in the body (being dehydrated).  Losing weight too quickly.  Having an organ transplant.  Having lead poisoning.  Taking certain medicines.  Having kidney disease.  Having a skin condition called psoriasis. What are the signs or symptoms? An attack of acute gout usually happens in just one joint. The most common place is the big toe. Attacks often start at night. Other joints that may be affected include joints of the feet, ankle, knee, fingers, wrist, or elbow. Symptoms of an attack may include:  Very bad pain.  Warmth.  Swelling.  Stiffness.  Shiny, red, or purple skin.  Tenderness. The affected joint may be very painful to touch.  Chills and fever. Chronic gout may cause symptoms more often. More joints may be involved. You may also have white or yellow lumps (tophi) on your hands or feet or in other areas near your joints. How is this treated?  Treatment for this condition has two phases: treating an acute attack and preventing future attacks.  Acute gout treatment may include: ? NSAIDs. ? Steroids. These are taken by mouth or injected into a joint. ? Colchicine. This medicine relieves pain and swelling. It can be given by mouth or through an IV tube.  Preventive treatment may include: ? Taking small doses of NSAIDs or  colchicine daily. ? Using a medicine that reduces uric acid levels in your blood. ? Making changes to your diet. You may need to see a food expert (dietitian) about what to eat and drink to prevent gout. Follow these instructions at home: During a gout attack   If told, put ice on the painful area: ? Put ice in a plastic bag. ? Place a towel between your skin and the bag. ? Leave the ice on for 20 minutes, 2-3 times a day.  Raise (elevate) the painful joint above the level of your heart  as often as you can.  Rest the joint as much as possible. If the joint is in your leg, you may be given crutches.  Follow instructions from your doctor about what you cannot eat or drink. Avoiding future gout attacks  Eat a low-purine diet. Avoid foods and drinks such as: ? Liver. ? Kidney. ? Anchovies. ? Asparagus. ? Herring. ? Mushrooms. ? Mussels. ? Beer.  Stay at a healthy weight. If you want to lose weight, talk with your doctor. Do not lose weight too fast.  Start or continue an exercise plan as told by your doctor. Eating and drinking  Drink enough fluids to keep your pee (urine) pale yellow.  If you drink alcohol: ? Limit how much you use to:  0-1 drink a day for women.  0-2 drinks a day for men. ? Be aware of how much alcohol is in your drink. In the [place] one drink equals one 12 oz bottle of beer (355 mL), one 5 oz glass of wine (148 mL), or one 1 oz glass of hard liquor (44 mL). General instructions  Take over-the-counter and prescription medicines only as told by your doctor.  Do not drive or use heavy machinery while taking prescription pain medicine.  Return to your normal activities as told by your doctor. Ask your doctor what activities are safe for you.  Keep all follow-up visits as told by your doctor. This is important. Contact a doctor if:  You have another gout attack.  You still have symptoms of a gout attack after 10 days of treatment.  You have problems (side effects) because of your medicines.  You have chills or a fever.  You have burning pain when you pee (urinate).  You have pain in your lower back or belly. Get help right away if:  You have very bad pain.  Your pain cannot be controlled.  You cannot pee. Summary  Gout is painful swelling of the joints.  The most common site of pain is the big toe, but it can affect other joints.  Medicines and avoiding some foods can help to prevent and treat gout attacks. This  information is not intended to replace advice given to you by your health care provider. Make sure you discuss any questions you have with your health care provider. Document Revised: 01/08/2018 Document Reviewed: 01/08/2018 Elsevier Patient Education  2020 [employer].

## 2020-04-27 NOTE — Progress Notes (Signed)
Joel Hughes 59 y.o.   Chief Complaint  Patient presents with  . Gout    per patient 3 days Great left toe    HISTORY OF PRESENT ILLNESS: This is a 59 y.o. male complaining of gout to left big toe for the past 3 days. Has history of gout and hyperuricemia.  Presently taking allopurinol 100 mg daily. No other complaints or medical concerns today.  HPI   Prior to Admission medications   Medication Sig Start Date End Date Taking? Authorizing Provider  allopurinol (ZYLOPRIM) 100 MG tablet Take 1 tablet (100 mg total) by mouth daily. 02/09/20 05/09/20 Yes Uno Esau, Eilleen Kempf, MD  pantoprazole (PROTONIX) 40 MG tablet TAKE 1 TABLET BY MOUTH EVERY DAY 09/30/19  Yes Jenifer Struve, Eilleen Kempf, MD  Vitamin D, Ergocalciferol, (DRISDOL) 1.25 MG (50000 UNIT) CAPS capsule TAKE 1 CAPSULE (50,000 UNITS TOTAL) BY MOUTH EVERY 7 (SEVEN) DAYS. 12/21/19  Yes Georgina Quint, MD  colchicine 0.6 MG tablet Take 1.2 mg now and 0.6 mg one hour later. Take 0.6 mg daily after that x 5 days. Patient not taking: Reported on 04/27/2020 08/11/19   Georgina Quint, MD    Not on File  Patient Active Problem List   Diagnosis Date Noted  . Great toe pain, left 04/11/2016  . Hypertriglyceridemia 11/02/2013    Past Medical History:  Diagnosis Date  . Allergy   . Seizures (HCC)     History reviewed. No pertinent surgical history.  Social History   Socioeconomic History  . Marital status: Married    Spouse name: Not on file  . Number of children: Not on file  . Years of education: Not on file  . Highest education level: Not on file  Occupational History  . Not on file  Tobacco Use  . Smoking status: Never Smoker  . Smokeless tobacco: Never Used  Substance and Sexual Activity  . Alcohol use: No  . Drug use: No  . Sexual activity: Never  Other Topics Concern  . Not on file  Social History Narrative  . Not on file   Social Determinants of Health   Financial Resource Strain:   .  Difficulty of Paying Living Expenses: Not on file  Food Insecurity:   . Worried About Programme researcher, broadcasting/film/video in the Last Year: Not on file  . Ran Out of Food in the Last Year: Not on file  Transportation Needs:   . Lack of Transportation (Medical): Not on file  . Lack of Transportation (Non-Medical): Not on file  Physical Activity:   . Days of Exercise per Week: Not on file  . Minutes of Exercise per Session: Not on file  Stress:   . Feeling of Stress : Not on file  Social Connections:   . Frequency of Communication with Friends and Family: Not on file  . Frequency of Social Gatherings with Friends and Family: Not on file  . Attends Religious Services: Not on file  . Active Member of Clubs or Organizations: Not on file  . Attends Banker Meetings: Not on file  . Marital Status: Not on file  Intimate Partner Violence:   . Fear of Current or Ex-Partner: Not on file  . Emotionally Abused: Not on file  . Physically Abused: Not on file  . Sexually Abused: Not on file    History reviewed. No pertinent family history.   Review of Systems  Constitutional: Negative.  Negative for chills and fever.  HENT: Negative.  Negative for  congestion and sore throat.   Respiratory: Negative.  Negative for cough and shortness of breath.   Cardiovascular: Negative.  Negative for chest pain and palpitations.  Gastrointestinal: Negative for abdominal pain, diarrhea, nausea and vomiting.  Genitourinary: Negative.  Negative for dysuria and hematuria.  Skin: Negative.  Negative for rash.  Neurological: Negative for dizziness and headaches.  All other systems reviewed and are negative.    Today's Vitals   04/27/20 1107  BP: (!) 168/82  Pulse: 67  Resp: 16  Temp: 98 F (36.7 C)  TempSrc: Temporal  SpO2: 98%  Weight: 149 lb 12.8 oz (67.9 kg)  Height: 5\' 6"  (1.676 m)   Body mass index is 24.18 kg/m.   Physical Exam Vitals reviewed.  HENT:     Head: Normocephalic.  Eyes:      Extraocular Movements: Extraocular movements intact.     Pupils: Pupils are equal, round, and reactive to light.  Cardiovascular:     Rate and Rhythm: Normal rate.  Pulmonary:     Effort: Pulmonary effort is normal.  Musculoskeletal:     Cervical back: Normal range of motion.     Comments: Left foot: Positive swelling, erythema, tenderness to first MTP joint compatible with gout  Skin:    General: Skin is warm and dry.     Capillary Refill: Capillary refill takes less than 2 seconds.  Neurological:     General: No focal deficit present.     Mental Status: He is alert and oriented to person, place, and time.  Psychiatric:        Mood and Affect: Mood normal.        Behavior: Behavior normal.      ASSESSMENT & PLAN: Joel Hughes was seen today for gout.  Diagnoses and all orders for this visit:  Acute gouty arthritis -     Comprehensive metabolic panel -     colchicine 0.6 MG tablet; Take 1.2 mg now and 0.6 mg one hour later. Take 0.6 mg daily after that x 5 days. -     indomethacin (INDOCIN) 50 MG capsule; Take 1 capsule (50 mg total) by mouth 3 (three) times daily with meals for 3 days.  Vitamin D deficiency -     Vitamin D, Ergocalciferol, (DRISDOL) 1.25 MG (50000 UNIT) CAPS capsule; Take 1 capsule (50,000 Units total) by mouth every 7 (seven) days. -     VITAMIN D 25 Hydroxy (Vit-D Deficiency, Fractures)  Hyperuricemia -     Uric Acid    Patient Instructions    Increase daily allopurinol to 200 mg.   If you have lab work done today you will be contacted with your lab results within the next 2 weeks.  If you have not heard from us then please contact us. The fastest way to get your results is to register for My Chart.   IF you received an x-ray today, you will receive an invoice [facility]. Please contact [facility] at [phone] with questions or concerns regarding your invoice.   IF you received labwork today, you will receive an invoice  from [facility]. Please contact [facility] at [phone] with questions or concerns regarding your invoice.   Our billing staff will not be able to assist you with questions regarding bills from these companies.  You will be contacted with the lab results as soon as they are available. The fastest way to get your results is to activate your My Chart account. Instructions are located on the last page  of this paperwork. If you have not heard from [place] regarding the results in 2 weeks, please contact this office.     Gout  Gout is painful swelling of your joints. Gout is a type of arthritis. It is caused by having too much uric acid in your body. Uric acid is a chemical that is made when your body breaks down substances called purines. If your body has too much uric acid, sharp crystals can form and build up in your joints. This causes pain and swelling. Gout attacks can happen quickly and be very painful (acute gout). Over time, the attacks can affect more joints and happen more often (chronic gout). What are the causes?  Too much uric acid in your blood. This can happen because: ? Your kidneys do not remove enough uric acid from your blood. ? Your body makes too much uric acid. ? You eat too many foods that are high in purines. These foods include organ meats, some seafood, and beer.  Trauma or stress. What increases the risk?  Having a family history of gout.  Being male and middle-aged.  Being male and having gone through menopause.  Being very overweight (obese).  Drinking alcohol, especially beer.  Not having enough water in the body (being dehydrated).  Losing weight too quickly.  Having an organ transplant.  Having lead poisoning.  Taking certain medicines.  Having kidney disease.  Having a skin condition called psoriasis. What are the signs or symptoms? An attack of acute gout usually happens in just one joint. The most common place is the big toe. Attacks often  start at night. Other joints that may be affected include joints of the feet, ankle, knee, fingers, wrist, or elbow. Symptoms of an attack may include:  Very bad pain.  Warmth.  Swelling.  Stiffness.  Shiny, red, or purple skin.  Tenderness. The affected joint may be very painful to touch.  Chills and fever. Chronic gout may cause symptoms more often. More joints may be involved. You may also have white or yellow lumps (tophi) on your hands or feet or in other areas near your joints. How is this treated?  Treatment for this condition has two phases: treating an acute attack and preventing future attacks.  Acute gout treatment may include: ? NSAIDs. ? Steroids. These are taken by mouth or injected into a joint. ? Colchicine. This medicine relieves pain and swelling. It can be given by mouth or through an IV tube.  Preventive treatment may include: ? Taking small doses of NSAIDs or colchicine daily. ? Using a medicine that reduces uric acid levels in your blood. ? Making changes to your diet. You may need to see a food expert (dietitian) about what to eat and drink to prevent gout. Follow these instructions at home: During a gout attack   If told, put ice on the painful area: ? Put ice in a plastic bag. ? Place a towel between your skin and the bag. ? Leave the ice on for 20 minutes, 2-3 times a day.  Raise (elevate) the painful joint above the level of your heart as often as you can.  Rest the joint as much as possible. If the joint is in your leg, you may be given crutches.  Follow instructions from your doctor about what you cannot eat or drink. Avoiding future gout attacks  Eat a low-purine diet. Avoid foods and drinks such as: ? Liver. ? Kidney. ? Anchovies. ? Asparagus. ? Herring. ? Mushrooms. ?  Mussels. ? Beer.  Stay at a healthy weight. If you want to lose weight, talk with your doctor. Do not lose weight too fast.  Start or continue an exercise plan as  told by your doctor. Eating and drinking  Drink enough fluids to keep your pee (urine) pale yellow.  If you drink alcohol: ? Limit how much you use to:  0-1 drink a day for women.  0-2 drinks a day for men. ? Be aware of how much alcohol is in your drink. In the [place] one drink equals one 12 oz bottle of beer (355 mL), one 5 oz glass of wine (148 mL), or one 1 oz glass of hard liquor (44 mL). General instructions  Take over-the-counter and prescription medicines only as told by your doctor.  Do not drive or use heavy machinery while taking prescription pain medicine.  Return to your normal activities as told by your doctor. Ask your doctor what activities are safe for you.  Keep all follow-up visits as told by your doctor. This is important. Contact a doctor if:  You have another gout attack.  You still have symptoms of a gout attack after 10 days of treatment.  You have problems (side effects) because of your medicines.  You have chills or a fever.  You have burning pain when you pee (urinate).  You have pain in your lower back or belly. Get help right away if:  You have very bad pain.  Your pain cannot be controlled.  You cannot pee. Summary  Gout is painful swelling of the joints.  The most common site of pain is the big toe, but it can affect other joints.  Medicines and avoiding some foods can help to prevent and treat gout attacks. This information is not intended to replace advice given to you by your health care provider. Make sure you discuss any questions you have with your health care provider. Document Revised: 01/08/2018 Document Reviewed: 01/08/2018 Elsevier Patient Education  2020 [employer]      Edwina Barth, MD [facility]

## 2020-04-28 LAB — URIC ACID: Uric Acid: 6 mg/dL (ref 3.8–8.4)

## 2020-04-28 LAB — VITAMIN D 25 HYDROXY (VIT D DEFICIENCY, FRACTURES): Vit D, 25-Hydroxy: 25.3 ng/mL — ABNORMAL LOW (ref 30.0–100.0)

## 2020-04-28 LAB — COMPREHENSIVE METABOLIC PANEL
ALT: 23 IU/L (ref 0–44)
AST: 21 IU/L (ref 0–40)
Albumin/Globulin Ratio: 1.4 (ref 1.2–2.2)
Albumin: 4.4 g/dL (ref 3.8–4.9)
Alkaline Phosphatase: 70 IU/L (ref 44–121)
BUN/Creatinine Ratio: 12 (ref 9–20)
BUN: 16 mg/dL (ref 6–24)
Bilirubin Total: 0.3 mg/dL (ref 0.0–1.2)
CO2: 29 mmol/L (ref 20–29)
Calcium: 9.6 mg/dL (ref 8.7–10.2)
Chloride: 103 mmol/L (ref 96–106)
Creatinine, Ser: 1.3 mg/dL — ABNORMAL HIGH (ref 0.76–1.27)
GFR calc Af Amer: 70 mL/min/{1.73_m2} (ref >59)
GFR calc non Af Amer: 60 mL/min/{1.73_m2} (ref >59)
Globulin, Total: 3.2 g/dL (ref 1.5–4.5)
Glucose: 121 mg/dL — ABNORMAL HIGH (ref 65–99)
Potassium: 4.2 mmol/L (ref 3.5–5.2)
Sodium: 143 mmol/L (ref 134–144)
Total Protein: 7.6 g/dL (ref 6.0–8.5)

## 2020-06-23 ENCOUNTER — Other Ambulatory Visit: Payer: Self-pay | Admitting: Emergency Medicine

## 2020-06-23 DIAGNOSIS — E559 Vitamin D deficiency, unspecified: Secondary | ICD-10-CM

## 2020-06-23 NOTE — Telephone Encounter (Signed)
Requested medications are due for refill today yes  Requested medications are on the active medication list yes  Last refill 11/20  Last visit 10/27  Future visit scheduled No   Notes to clinic Not Delegated (also it is listed twice on med list.)

## 2020-08-01 ENCOUNTER — Other Ambulatory Visit: Payer: Self-pay

## 2020-08-01 ENCOUNTER — Encounter: Payer: Self-pay | Admitting: Emergency Medicine

## 2020-08-01 ENCOUNTER — Ambulatory Visit (INDEPENDENT_AMBULATORY_CARE_PROVIDER_SITE_OTHER): Payer: BC Managed Care – PPO | Admitting: Emergency Medicine

## 2020-08-01 VITALS — BP 158/82 | HR 85 | Temp 98.0°F | Resp 16 | Ht 66.0 in | Wt 149.0 lb

## 2020-08-01 DIAGNOSIS — I10 Essential (primary) hypertension: Secondary | ICD-10-CM | POA: Insufficient documentation

## 2020-08-01 DIAGNOSIS — K2100 Gastro-esophageal reflux disease with esophagitis, without bleeding: Secondary | ICD-10-CM | POA: Diagnosis not present

## 2020-08-01 DIAGNOSIS — Z8739 Personal history of other diseases of the musculoskeletal system and connective tissue: Secondary | ICD-10-CM | POA: Diagnosis not present

## 2020-08-01 DIAGNOSIS — Z1211 Encounter for screening for malignant neoplasm of colon: Secondary | ICD-10-CM | POA: Diagnosis not present

## 2020-08-01 MED ORDER — LOSARTAN POTASSIUM 50 MG PO TABS: 50.0000 mg | ORAL_TABLET | Freq: Every day | ORAL | 3 refills | Status: AC

## 2020-08-01 MED ORDER — ALLOPURINOL 100 MG PO TABS: 200.0000 mg | ORAL_TABLET | Freq: Every day | ORAL | 3 refills | Status: AC

## 2020-08-01 NOTE — Progress Notes (Signed)
Joel Hughes 60 y.o.   Chief Complaint  Patient presents with  . Gout    Per patient left foot pain  . Hypertension    Per patient elevated at home in the 150's    HISTORY OF PRESENT ILLNESS: This is a 60 y.o. male with history of gout and frequent gout flareups.  Presently taking allopurinol 100 mg daily. Takes colchicine for flareups but on a regular basis gives him diarrhea. Also complaining of increased blood pressure readings at home.  May need medication. Also has a history of GERD on pantoprazole but not helping much.  May need upper endoscopy. Not vaccinated against Covid. No other complaints or medical concerns today. Lab Results  Component Value Date   CREATININE 1.30 (H) 04/27/2020   BUN 16 04/27/2020   NA 143 04/27/2020   K 4.2 04/27/2020   CL 103 04/27/2020   CO2 29 04/27/2020    HPI   Prior to Admission medications   Medication Sig Start Date End Date Taking? Authorizing Provider  colchicine 0.6 MG tablet Take 1.2 mg now and 0.6 mg one hour later. Take 0.6 mg daily after that x 5 days. 08/11/19  Yes Georgina Quint, MD  pantoprazole (PROTONIX) 40 MG tablet TAKE 1 TABLET BY MOUTH EVERY DAY 09/30/19  Yes Jameson Tormey, Eilleen Kempf, MD  Vitamin D, Ergocalciferol, (DRISDOL) 1.25 MG (50000 UNIT) CAPS capsule TAKE 1 CAPSULE (50,000 UNITS TOTAL) BY MOUTH EVERY 7 (SEVEN) DAYS. 04/29/20  Yes Carolyne Whitsel, Eilleen Kempf, MD  Vitamin D, Ergocalciferol, (DRISDOL) 1.25 MG (50000 UNIT) CAPS capsule TAKE 1 CAPSULE (50,000 UNITS TOTAL) BY MOUTH EVERY 7 (SEVEN) DAYS 06/23/20  Yes Georgina Quint, MD  allopurinol (ZYLOPRIM) 100 MG tablet Take 1 tablet (100 mg total) by mouth daily. 02/09/20 05/09/20  Georgina Quint, MD  colchicine 0.6 MG tablet Take 1.2 mg now and 0.6 mg one hour later. Take 0.6 mg daily after that x 5 days. Patient not taking: Reported on 08/01/2020 04/27/20   Georgina Quint, MD    Not on File  Patient Active Problem List   Diagnosis Date Noted   . Great toe pain, left 04/11/2016  . Hypertriglyceridemia 11/02/2013    Past Medical History:  Diagnosis Date  . Allergy   . Seizures (HCC)     History reviewed. No pertinent surgical history.  Social History   Socioeconomic History  . Marital status: Married    Spouse name: Not on file  . Number of children: Not on file  . Years of education: Not on file  . Highest education level: Not on file  Occupational History  . Not on file  Tobacco Use  . Smoking status: Never Smoker  . Smokeless tobacco: Never Used  Substance and Sexual Activity  . Alcohol use: No  . Drug use: No  . Sexual activity: Never  Other Topics Concern  . Not on file  Social History Narrative  . Not on file   Social Determinants of Health   Financial Resource Strain: Not on file  Food Insecurity: Not on file  Transportation Needs: Not on file  Physical Activity: Not on file  Stress: Not on file  Social Connections: Not on file  Intimate Partner Violence: Not on file    History reviewed. No pertinent family history.   Review of Systems  Constitutional: Negative.  Negative for chills and fever.  HENT: Negative.  Negative for congestion and sore throat.   Respiratory: Negative.  Negative for shortness of breath.  Cardiovascular: Negative.  Negative for chest pain and palpitations.  Gastrointestinal: Positive for heartburn. Negative for abdominal pain, blood in stool, diarrhea, melena, nausea and vomiting.  Genitourinary: Negative.  Negative for dysuria and hematuria.  Musculoskeletal: Positive for joint pain.  Skin: Negative.  Negative for rash.  Neurological: Negative.  Negative for dizziness and headaches.  All other systems reviewed and are negative.    Today's Vitals   08/01/20 0904  BP: (!) 158/82  Pulse: 85  Resp: 16  Temp: 98 F (36.7 C)  TempSrc: Temporal  SpO2: 98%  Weight: 149 lb (67.6 kg)  Height: 5\' 6"  (1.676 m)   Body mass index is 24.05 kg/m.   Physical  Exam Vitals reviewed.  Constitutional:      Appearance: Normal appearance.  HENT:     Head: Normocephalic.  Eyes:     Extraocular Movements: Extraocular movements intact.     Pupils: Pupils are equal, round, and reactive to light.  Cardiovascular:     Rate and Rhythm: Normal rate and regular rhythm.     Pulses: Normal pulses.     Heart sounds: Normal heart sounds.  Pulmonary:     Effort: Pulmonary effort is normal.     Breath sounds: Normal breath sounds.  Musculoskeletal:        General: Normal range of motion.     Cervical back: Normal range of motion.     Comments: Left foot: Much improved physical findings at MTP joint of first toe.  Still slightly tender to palpation but no significant swelling or erythema. No tophi on physical exam.  Skin:    General: Skin is warm and dry.     Capillary Refill: Capillary refill takes less than 2 seconds.  Neurological:     General: No focal deficit present.     Mental Status: He is alert.  Psychiatric:        Mood and Affect: Mood normal.        Behavior: Behavior normal.    A total of 30 minutes was spent with the patient, greater than 50% of which was in counseling/coordination of care regarding hypertension and cardiovascular risks associated with this condition, need to start new medication losartan 50 mg daily, need to continue monitoring blood pressure readings at home, education on nutrition, gout and chronic treatment to avoid tophi and renal failure, treatment of GERD and need for GI evaluation and possible upper endoscopy, review of most recent blood work results, review of most recent office visit notes, prognosis, documentation, need for follow-up.   ASSESSMENT & PLAN: Essential hypertension Elevated blood pressure readings in the office and at home.  We will start losartan 50 mg daily.  Recheck in 3 months.  History of gout Getting more frequent flareups.  No tophi on physical exam.  Kidney function test still normal.  Will  increase allopurinol to 200 mg daily.  Will avoid daily colchicine due to diarrhea side effects.  Gastroesophageal reflux disease with esophagitis without hemorrhage Persistent GERD symptoms despite PPI treatment.  Will refer to GI for possible upper endoscopy.  Jabaree was seen today for gout and hypertension.  Diagnoses and all orders for this visit:  Essential hypertension -     losartan (COZAAR) 50 MG tablet; Take 1 tablet (50 mg total) by mouth daily.  Screening for colon cancer -     Ambulatory referral to Gastroenterology  History of gout -     allopurinol (ZYLOPRIM) 100 MG tablet; Take 2 tablets (200 mg total)  by mouth daily.  Gastroesophageal reflux disease with esophagitis without hemorrhage -     Ambulatory referral to Gastroenterology    Patient Instructions       If you have lab work done today you will be contacted with your lab results within the next 2 weeks.  If you have not heard from [place] then please contact us. The fastest way to get your results is to register for My Chart.   IF you received an x-ray today, you will receive an invoice from [facility]. Please contact [facility] at [phone] with questions or concerns regarding your invoice.   IF you received labwork today, you will receive an invoice from [place]. Please contact [facility] at [phone] with questions or concerns regarding your invoice.   Our billing staff will not be able to assist you with questions regarding bills from these companies.  You will be contacted with the lab results as soon as they are available. The fastest way to get your results is to activate your My Chart account. Instructions are located on the last page of this paperwork. If you have not heard from [place] regarding the results in 2 weeks, please contact this office.     Gout  Gout is painful swelling of your joints. Gout is a type of arthritis. It is caused by having too much uric acid in your  body. Uric acid is a chemical that is made when your body breaks down substances called purines. If your body has too much uric acid, sharp crystals can form and build up in your joints. This causes pain and swelling. Gout attacks can happen quickly and be very painful (acute gout). Over time, the attacks can affect more joints and happen more often (chronic gout). What are the causes?  Too much uric acid in your blood. This can happen because: ? Your kidneys do not remove enough uric acid from your blood. ? Your body makes too much uric acid. ? You eat too many foods that are high in purines. These foods include organ meats, some seafood, and beer.  Trauma or stress. What increases the risk?  Having a family history of gout.  Being male and middle-aged.  Being male and having gone through menopause.  Being very overweight (obese).  Drinking alcohol, especially beer.  Not having enough water in the body (being dehydrated).  Losing weight too quickly.  Having an organ transplant.  Having lead poisoning.  Taking certain medicines.  Having kidney disease.  Having a skin condition called psoriasis. What are the signs or symptoms? An attack of acute gout usually happens in just one joint. The most common place is the big toe. Attacks often start at night. Other joints that may be affected include joints of the feet, ankle, knee, fingers, wrist, or elbow. Symptoms of an attack may include:  Very bad pain.  Warmth.  Swelling.  Stiffness.  Shiny, red, or purple skin.  Tenderness. The affected joint may be very painful to touch.  Chills and fever. Chronic gout may cause symptoms more often. More joints may be involved. You may also have white or yellow lumps (tophi) on your hands or feet or in other areas near your joints.   How is this treated?  Treatment for this condition has two phases: treating an acute attack and preventing future attacks.  Acute gout treatment  may include: ? NSAIDs. ? Steroids. These are taken by mouth or injected into a joint. ? Colchicine. This medicine relieves  pain and swelling. It can be given by mouth or through an IV tube.  Preventive treatment may include: ? Taking small doses of NSAIDs or colchicine daily. ? Using a medicine that reduces uric acid levels in your blood. ? Making changes to your diet. You may need to see a food expert (dietitian) about what to eat and drink to prevent gout. Follow these instructions at home: During a gout attack  If told, put ice on the painful area: ? Put ice in a plastic bag. ? Place a towel between your skin and the bag. ? Leave the ice on for 20 minutes, 2-3 times a day.  Raise (elevate) the painful joint above the level of your heart as often as you can.  Rest the joint as much as possible. If the joint is in your leg, you may be given crutches.  Follow instructions from your doctor about what you cannot eat or drink.   Avoiding future gout attacks  Eat a low-purine diet. Avoid foods and drinks such as: ? Liver. ? Kidney. ? Anchovies. ? Asparagus. ? Herring. ? Mushrooms. ? Mussels. ? Beer.  Stay at a healthy weight. If you want to lose weight, talk with your doctor. Do not lose weight too fast.  Start or continue an exercise plan as told by your doctor. Eating and drinking  Drink enough fluids to keep your pee (urine) pale yellow.  If you drink alcohol: ? Limit how much you use to:  0-1 drink a day for women.  0-2 drinks a day for men. ? Be aware of how much alcohol is in your drink. In the [place] one drink equals one 12 oz bottle of beer (355 mL), one 5 oz glass of wine (148 mL), or one 1 oz glass of hard liquor (44 mL). General instructions  Take over-the-counter and prescription medicines only as told by your doctor.  Do not drive or use heavy machinery while taking prescription pain medicine.  Return to your normal activities as told by your doctor. Ask  your doctor what activities are safe for you.  Keep all follow-up visits as told by your doctor. This is important. Contact a doctor if:  You have another gout attack.  You still have symptoms of a gout attack after 10 days of treatment.  You have problems (side effects) because of your medicines.  You have chills or a fever.  You have burning pain when you pee (urinate).  You have pain in your lower back or belly. Get help right away if:  You have very bad pain.  Your pain cannot be controlled.  You cannot pee. Summary  Gout is painful swelling of the joints.  The most common site of pain is the big toe, but it can affect other joints.  Medicines and avoiding some foods can help to prevent and treat gout attacks. This information is not intended to replace advice given to you by your health care provider. Make sure you discuss any questions you have with your health care provider. Document Revised: 01/08/2018 Document Reviewed: 01/08/2018 Elsevier Patient Education  2021 Elsevier Inc.      Edwina Barth, MD [facility]

## 2020-08-01 NOTE — Assessment & Plan Note (Signed)
Persistent GERD symptoms despite PPI treatment.  Will refer to GI for possible upper endoscopy.

## 2020-08-01 NOTE — Assessment & Plan Note (Signed)
Elevated blood pressure readings in the office and at home.  We will start losartan 50 mg daily.  Recheck in 3 months.

## 2020-08-01 NOTE — Patient Instructions (Addendum)
If you have lab work done today you will be contacted with your lab results within the next 2 weeks.  If you have not heard from [place] then please contact us. The fastest way to get your results is to register for My Chart.   IF you received an x-ray today, you will receive an invoice from [facility]. Please contact [facility] at [phone] with questions or concerns regarding your invoice.   IF you received labwork today, you will receive an invoice from [place]. Please contact [facility] at [phone] with questions or concerns regarding your invoice.   Our billing staff will not be able to assist you with questions regarding bills from these companies.  You will be contacted with the lab results as soon as they are available. The fastest way to get your results is to activate your My Chart account. Instructions are located on the last page of this paperwork. If you have not heard from [place] regarding the results in 2 weeks, please contact this office.     Gout  Gout is painful swelling of your joints. Gout is a type of arthritis. It is caused by having too much uric acid in your body. Uric acid is a chemical that is made when your body breaks down substances called purines. If your body has too much uric acid, sharp crystals can form and build up in your joints. This causes pain and swelling. Gout attacks can happen quickly and be very painful (acute gout). Over time, the attacks can affect more joints and happen more often (chronic gout). What are the causes?  Too much uric acid in your blood. This can happen because: ? Your kidneys do not remove enough uric acid from your blood. ? Your body makes too much uric acid. ? You eat too many foods that are high in purines. These foods include organ meats, some seafood, and beer.  Trauma or stress. What increases the risk?  Having a family history of gout.  Being male and middle-aged.  Being male and having gone  through menopause.  Being very overweight (obese).  Drinking alcohol, especially beer.  Not having enough water in the body (being dehydrated).  Losing weight too quickly.  Having an organ transplant.  Having lead poisoning.  Taking certain medicines.  Having kidney disease.  Having a skin condition called psoriasis. What are the signs or symptoms? An attack of acute gout usually happens in just one joint. The most common place is the big toe. Attacks often start at night. Other joints that may be affected include joints of the feet, ankle, knee, fingers, wrist, or elbow. Symptoms of an attack may include:  Very bad pain.  Warmth.  Swelling.  Stiffness.  Shiny, red, or purple skin.  Tenderness. The affected joint may be very painful to touch.  Chills and fever. Chronic gout may cause symptoms more often. More joints may be involved. You may also have white or yellow lumps (tophi) on your hands or feet or in other areas near your joints.   How is this treated?  Treatment for this condition has two phases: treating an acute attack and preventing future attacks.  Acute gout treatment may include: ? NSAIDs. ? Steroids. These are taken by mouth or injected into a joint. ? Colchicine. This medicine relieves pain and swelling. It can be given by mouth or through an IV tube.  Preventive treatment may include: ? Taking small doses of NSAIDs or colchicine daily. ?  Using a medicine that reduces uric acid levels in your blood. ? Making changes to your diet. You may need to see a food expert (dietitian) about what to eat and drink to prevent gout. Follow these instructions at home: During a gout attack  If told, put ice on the painful area: ? Put ice in a plastic bag. ? Place a towel between your skin and the bag. ? Leave the ice on for 20 minutes, 2-3 times a day.  Raise (elevate) the painful joint above the level of your heart as often as you can.  Rest the joint as  much as possible. If the joint is in your leg, you may be given crutches.  Follow instructions from your doctor about what you cannot eat or drink.   Avoiding future gout attacks  Eat a low-purine diet. Avoid foods and drinks such as: ? Liver. ? Kidney. ? Anchovies. ? Asparagus. ? Herring. ? Mushrooms. ? Mussels. ? Beer.  Stay at a healthy weight. If you want to lose weight, talk with your doctor. Do not lose weight too fast.  Start or continue an exercise plan as told by your doctor. Eating and drinking  Drink enough fluids to keep your pee (urine) pale yellow.  If you drink alcohol: ? Limit how much you use to:  0-1 drink a day for women.  0-2 drinks a day for men. ? Be aware of how much alcohol is in your drink. In the [place] one drink equals one 12 oz bottle of beer (355 mL), one 5 oz glass of wine (148 mL), or one 1 oz glass of hard liquor (44 mL). General instructions  Take over-the-counter and prescription medicines only as told by your doctor.  Do not drive or use heavy machinery while taking prescription pain medicine.  Return to your normal activities as told by your doctor. Ask your doctor what activities are safe for you.  Keep all follow-up visits as told by your doctor. This is important. Contact a doctor if:  You have another gout attack.  You still have symptoms of a gout attack after 10 days of treatment.  You have problems (side effects) because of your medicines.  You have chills or a fever.  You have burning pain when you pee (urinate).  You have pain in your lower back or belly. Get help right away if:  You have very bad pain.  Your pain cannot be controlled.  You cannot pee. Summary  Gout is painful swelling of the joints.  The most common site of pain is the big toe, but it can affect other joints.  Medicines and avoiding some foods can help to prevent and treat gout attacks. This information is not intended to replace advice  given to you by your health care provider. Make sure you discuss any questions you have with your health care provider. Document Revised: 01/08/2018 Document Reviewed: 01/08/2018 Elsevier Patient Education  2021 [employer].

## 2020-08-01 NOTE — Assessment & Plan Note (Signed)
Getting more frequent flareups.  No tophi on physical exam.  Kidney function test still normal.  Will increase allopurinol to 200 mg daily.  Will avoid daily colchicine due to diarrhea side effects.

## 2020-08-07 ENCOUNTER — Other Ambulatory Visit: Payer: Self-pay | Admitting: Emergency Medicine

## 2020-08-07 DIAGNOSIS — Z8739 Personal history of other diseases of the musculoskeletal system and connective tissue: Secondary | ICD-10-CM

## 2020-08-23 ENCOUNTER — Other Ambulatory Visit: Payer: Self-pay | Admitting: Emergency Medicine

## 2020-08-23 DIAGNOSIS — E559 Vitamin D deficiency, unspecified: Secondary | ICD-10-CM

## 2020-08-23 NOTE — Telephone Encounter (Signed)
Requested medications are due for refill today.  yes  Requested medications are on the active medications list.  yes  Last refill. 04/29/2020  Future visit scheduled.   Yes  Notes to clinic.  Medication not delegated.

## 2020-10-24 ENCOUNTER — Ambulatory Visit: Payer: Self-pay | Admitting: Emergency Medicine
